# Patient Record
Sex: Male | Born: 1955 | Race: Black or African American | Hispanic: No | Marital: Married | Smoking: Current every day smoker
Health system: Southern US, Community
[De-identification: ages and names within clinical notes are randomized; demographics above are authoritative.]

## PROBLEM LIST (undated history)

## (undated) ENCOUNTER — Ambulatory Visit: Admission: EM | Payer: 59 | Source: Home / Self Care

## (undated) DIAGNOSIS — I739 Peripheral vascular disease, unspecified: Secondary | ICD-10-CM

## (undated) DIAGNOSIS — B9681 Helicobacter pylori [H. pylori] as the cause of diseases classified elsewhere: Secondary | ICD-10-CM

## (undated) DIAGNOSIS — N529 Male erectile dysfunction, unspecified: Secondary | ICD-10-CM

## (undated) DIAGNOSIS — Z87442 Personal history of urinary calculi: Secondary | ICD-10-CM

## (undated) DIAGNOSIS — I10 Essential (primary) hypertension: Secondary | ICD-10-CM

## (undated) DIAGNOSIS — R7301 Impaired fasting glucose: Secondary | ICD-10-CM

## (undated) DIAGNOSIS — K279 Peptic ulcer, site unspecified, unspecified as acute or chronic, without hemorrhage or perforation: Secondary | ICD-10-CM

## (undated) HISTORY — DX: Male erectile dysfunction, unspecified: N52.9

## (undated) HISTORY — DX: Peripheral vascular disease, unspecified: I73.9

## (undated) HISTORY — DX: Helicobacter pylori (H. pylori) as the cause of diseases classified elsewhere: B96.81

## (undated) HISTORY — DX: Essential (primary) hypertension: I10

## (undated) HISTORY — DX: Impaired fasting glucose: R73.01

## (undated) HISTORY — DX: Helicobacter pylori (H. pylori) as the cause of diseases classified elsewhere: K27.9

---

## 2007-10-18 ENCOUNTER — Ambulatory Visit: Payer: Self-pay | Admitting: Gastroenterology

## 2007-10-18 ENCOUNTER — Encounter: Payer: Self-pay | Admitting: Gastroenterology

## 2007-10-18 ENCOUNTER — Ambulatory Visit (HOSPITAL_COMMUNITY): Admission: RE | Admit: 2007-10-18 | Discharge: 2007-10-18 | Payer: Self-pay | Admitting: Gastroenterology

## 2007-10-18 HISTORY — PX: COLONOSCOPY: SHX174

## 2010-07-20 NOTE — Op Note (Signed)
Gary Harrison, Gary Harrison              ACCOUNT NO.:  [id number]   MEDICAL RECORD NO.:  [id number]          PATIENT TYPE:  AMB   LOCATION:  DAY                           FACILITY:  [facility]   PHYSICIAN:  Kassie Mends, M.D.      DATE OF BIRTH:  Sep 10, 1955   DATE OF PROCEDURE:  DATE OF DISCHARGE:                               OPERATIVE REPORT   REFERRING PHYSICIAN:  Donna Bernard, MD   PROCEDURE:  Colonoscopy with snare cautery and cold forceps polypectomy.   INDICATION FOR EXAM:  Mr. Carvalho is a 55 year old male who presents  for average risk colon cancer screening.   FINDINGS:  Three descending colon polyps.  The polyps ranged from 8 mm  to 1.2 cm.  One pedunculated sigmoid colon polyp removed via snare  cautery; others via cold forceps. One 3-mm cecal polyp removed via cold  forceps.  Otherwise, no masses, inflammatory changes, diverticular, or  arteriovenous malformations seen.   DIAGNOSIS:  Multiple 8 mm to 1.1-cm colon polyps removed.   RECOMMENDATIONS:  1. He should follow high-fiber diet.  He was given a handout on high-      fiber diet and polyps.  2. Screening colonoscopy in 5 years. First degree relatives need TCS      at age 21 and then every 5 years.  3. No aspirin, NSAIDs, or anticoagulation for 7 days.   MEDICATIONS:  1. Demerol 100 mg IV.  2. Versed 6 mg IV.   PROCEDURE TECHNIQUE:  Physical exam was performed.  Informed consent was  obtained from the patient.  After explaining the benefits, risks, and  alternatives to the procedure.  The patient was connected to monitor and  placed in left lateral position.  Continuous oxygen was provided by  nasal cannula, IV medicine administered through an indwelling cannula.  After administration of sedation and rectal exam, the patient's rectum  was intubated and scope was advanced under direct visualization to the  cecum.  The scope was removed slowly by carefully examining  the color, texture, anatomy, and integrity of  mucosa on the way out.  The patient was recovered in endoscopy and discharged to home in  satisfactory condition.   PATH:  Multiple simple adenomas.      Kassie Mends, M.D.  Electronically Signed     SM/MEDQ  D:  10/18/2007  T:  10/18/2007  Job:  [id number]   cc:   Donna Bernard, M.D.  Fax: [phone]

## 2012-08-03 ENCOUNTER — Other Ambulatory Visit: Payer: Self-pay | Admitting: Family Medicine

## 2012-09-05 ENCOUNTER — Other Ambulatory Visit: Payer: Self-pay | Admitting: Family Medicine

## 2012-11-29 ENCOUNTER — Ambulatory Visit: Payer: Self-pay | Admitting: Gastroenterology

## 2012-12-03 ENCOUNTER — Ambulatory Visit: Payer: Self-pay | Admitting: Gastroenterology

## 2012-12-27 ENCOUNTER — Encounter: Payer: Self-pay | Admitting: Gastroenterology

## 2012-12-27 ENCOUNTER — Ambulatory Visit (INDEPENDENT_AMBULATORY_CARE_PROVIDER_SITE_OTHER): Payer: 59 | Admitting: Gastroenterology

## 2012-12-27 ENCOUNTER — Encounter (INDEPENDENT_AMBULATORY_CARE_PROVIDER_SITE_OTHER): Payer: Self-pay

## 2012-12-27 VITALS — BP 160/100 | HR 75 | Temp 98.3°F | Ht 71.0 in | Wt 179.6 lb

## 2012-12-27 DIAGNOSIS — Z860100 Personal history of colon polyps, unspecified: Secondary | ICD-10-CM | POA: Insufficient documentation

## 2012-12-27 DIAGNOSIS — R198 Other specified symptoms and signs involving the digestive system and abdomen: Secondary | ICD-10-CM

## 2012-12-27 MED ORDER — PEG 3350-KCL-NA BICARB-NACL 420 G PO SOLR: 4000.0000 mL | ORAL | Status: DC

## 2012-12-27 NOTE — Patient Instructions (Signed)
We have referred you to see Dr. Gerda Diss again for management of your blood pressure.  I would like to get an ultrasound of your belly; we will call you with the results. Try to limit alcohol intake, which can damage your liver.   We are scheduling a colonoscopy with Dr. Darrick Penna, which will be done after you have seen Dr. Gerda Diss.

## 2012-12-27 NOTE — Progress Notes (Signed)
   Primary Care Physician:  Harlow Asa, MD Primary Gastroenterologist:  Dr. Darrick Penna   Chief Complaint  Patient presents with  . Colonoscopy    HPI:   Gary Harrison presents today with history of multiple simple adenomas in 2009, due for surveillance now. Denies abdominal pain. No wt loss, changes in appetite. Denies constipation, diarrhea. Low-volume hematochezia. Notes history of hematuria. No dysphagia. Blood pressure elevated. Hasn't seen PCP recently.    Past Medical History  Diagnosis Date  . Hypertension     no meds currently    Past Surgical History  Procedure Laterality Date  . Colonoscopy  10/18/2007      ZOX:WRUEA descending colon polyps.  The polyps ranged from 8 mm  to 1.2 cm.  One pedunculated sigmoid colon polyp removed via snare cautery; others via cold forceps. One 3-mm cecal polyp removed via cold forceps.  Otherwise, no masses, inflammatory changes, diverticular, or arteriovenous malformations seen. simple adenomas. Needs surveillance 2014    Current Outpatient Prescriptions  Medication Sig Dispense Refill  . fluticasone (FLONASE) 50 MCG/ACT nasal spray instill 2 sprays into each nostril once daily  16 g  6  . VIAGRA 25 MG tablet TAKE ONE TABLET 2 HOURS BEFORE SEX  10 tablet  3   No current facility-administered medications for this visit.    Allergies as of 12/27/2012  . (Not on File)    Family History  Problem Relation Age of Onset  . Colon cancer Neg Hx     History   Social History  . Marital Status: Married    Spouse Name: N/A    Number of Children: N/A  . Years of Education: N/A   Occupational History  . [employer]    Social History Main Topics  . Smoking status: Current Every Day Smoker -- 0.50 packs/day    Types: Cigarettes  . Smokeless tobacco: Not on file  . Alcohol Use: Yes     Comment: 12 pack a week  . Drug Use: No  . Sexual Activity: Not on file   Other Topics Concern  . Not on file   Social History Narrative  .  No narrative on file    Review of Systems: As mentioned in HPI.   Physical Exam: BP 160/100  Pulse 75  Temp(Src) 98.3 F (36.8 C) (Oral)  Ht 5\' 11"  (1.803 m)  Wt 179 lb 9.6 oz (81.466 kg)  BMI 25.06 kg/m2 General:   Alert and oriented. Pleasant and cooperative. Well-nourished and well-developed.  Head:  Normocephalic and atraumatic. Eyes:  Without icterus, sclera clear and conjunctiva pink.  Ears:  Normal auditory acuity. Nose:  No deformity, discharge,  or lesions. Mouth:  No deformity or lesions, oral mucosa pink.  Neck:  Supple, without mass or thyromegaly. Lungs:  Clear to auscultation bilaterally. No wheezes, rales, or rhonchi. No distress.  Heart:  S1, S2 present without murmurs appreciated.  Abdomen:  +BS, soft, non-tender and non-distended. RUQ fullness appreciated, questionable superficial lipoma, RUQ.  Rectal:  Deferred  Msk:  Symmetrical without gross deformities. Normal posture. Extremities:  Without clubbing or edema. Neurologic:  Alert and  oriented x4;  grossly normal neurologically. Skin:  Intact without significant lesions or rashes. Cervical Nodes:  No significant cervical adenopathy. Psych:  Alert and cooperative. Normal mood and affect.

## 2012-12-31 ENCOUNTER — Other Ambulatory Visit (HOSPITAL_COMMUNITY): Payer: 59

## 2012-12-31 NOTE — Assessment & Plan Note (Addendum)
Due for surveillance now. Low-volume hematochezia likely benign. BP elevated today; no medications. Needs evaluation with PCP prior to elective procedure.   Referral to Dr. Gerda Diss to likely start anti-hypertensives. Once evaluated, proceed with TCS with Dr. Darrick Penna. Risks and benefits discussed in detail with patient, who stated understanding.

## 2012-12-31 NOTE — Assessment & Plan Note (Signed)
RUQ fullness on exam; proceed with [place] of abdomen.

## 2013-01-01 NOTE — Progress Notes (Signed)
cc'd to pcp 

## 2013-01-04 ENCOUNTER — Ambulatory Visit (HOSPITAL_COMMUNITY)
Admission: RE | Admit: 2013-01-04 | Discharge: 2013-01-04 | Disposition: A | Discharge status: Home / Self Care | Payer: 59 | Source: Ambulatory Visit | Attending: Gastroenterology | Admitting: Gastroenterology

## 2013-01-04 DIAGNOSIS — R198 Other specified symptoms and signs involving the digestive system and abdomen: Secondary | ICD-10-CM

## 2013-01-04 DIAGNOSIS — R1011 Right upper quadrant pain: Secondary | ICD-10-CM | POA: Insufficient documentation

## 2013-01-11 ENCOUNTER — Encounter: Payer: Self-pay | Admitting: Family Medicine

## 2013-01-11 ENCOUNTER — Ambulatory Visit (INDEPENDENT_AMBULATORY_CARE_PROVIDER_SITE_OTHER): Payer: 59 | Admitting: Family Medicine

## 2013-01-11 VITALS — BP 150/92 | Ht 71.0 in | Wt 180.0 lb

## 2013-01-11 DIAGNOSIS — Z125 Encounter for screening for malignant neoplasm of prostate: Secondary | ICD-10-CM

## 2013-01-11 DIAGNOSIS — E785 Hyperlipidemia, unspecified: Secondary | ICD-10-CM

## 2013-01-11 DIAGNOSIS — R198 Other specified symptoms and signs involving the digestive system and abdomen: Secondary | ICD-10-CM

## 2013-01-11 DIAGNOSIS — I10 Essential (primary) hypertension: Secondary | ICD-10-CM

## 2013-01-11 DIAGNOSIS — Z79899 Other long term (current) drug therapy: Secondary | ICD-10-CM

## 2013-01-11 LAB — BASIC METABOLIC PANEL
BUN: 14 mg/dL (ref 6–23)
Chloride: 101 mEq/L (ref 96–112)
Creat: 0.88 mg/dL (ref 0.50–1.35)
Glucose, Bld: 97 mg/dL (ref 70–99)
Potassium: 4.6 mEq/L (ref 3.5–5.3)
Sodium: 140 mEq/L (ref 135–145)

## 2013-01-11 LAB — HEPATIC FUNCTION PANEL
ALT: 26 U/L (ref 0–53)
AST: 26 U/L (ref 0–37)
Alkaline Phosphatase: 87 U/L (ref 39–117)
Bilirubin, Direct: 0.1 mg/dL (ref 0.0–0.3)
Indirect Bilirubin: 0.4 mg/dL (ref 0.0–0.9)
Total Bilirubin: 0.5 mg/dL (ref 0.3–1.2)

## 2013-01-11 LAB — LIPID PANEL
Cholesterol: 173 mg/dL (ref 0–200)
Triglycerides: 298 mg/dL — ABNORMAL HIGH (ref <150)
VLDL: 60 mg/dL — ABNORMAL HIGH (ref 0–40)

## 2013-01-11 MED ORDER — ENALAPRIL MALEATE 10 MG PO TABS: 10.0000 mg | ORAL_TABLET | Freq: Every day | ORAL | Status: DC

## 2013-01-11 NOTE — Progress Notes (Signed)
  Subjective:    Patient ID: Gary Harrison, male    DOB: 30-Jun-1955, 57 y.o.   MRN: [id number]  HPI Patient is here today due to high BP's.  He was going to get a colonoscopy done, but his BP was too high (150's/90's) so they sent him here to get checked out.  Pt is under a lot of stress as well.   Did ultrasound after they palpated "somehting" pt unaware of results. Patient states he is unaware of ultrasound results. On further history he does drink a fair amount of alcohol a least 12 beers per week along with several mixed drinks. Strong family history of hypertension.  Review of prior numbers over the last several years reveal elevated blood pressure Review of Systems No chest pain no back pain no abdominal pain no change in bowel habits no blood in stool ROS otherwise negative    Objective:   Physical Exam Alert HEENT normal. Lungs clear. Heart rare rhythm. Right upper quadrant head showed liver palpated. Not tender. Abdomen otherwise normal. Blood pressure 152/94 on repeat.     Assessment & Plan:  Impression 1 hypertension time the start medicine. Discussed with patient. Number to call liver with changes potential he significant and consistent with possible cirrhosis discussed. Patient encouraged to stop drinking alcohol right away. Plan 25 minutes spent most in discussion. Initiate Vasotec 10 mg each bedtime. Followup as scheduled. Followup with the GI specialist.

## 2013-01-12 LAB — PSA: PSA: 0.64 ng/mL (ref <=4.00)

## 2013-01-13 DIAGNOSIS — I10 Essential (primary) hypertension: Secondary | ICD-10-CM | POA: Insufficient documentation

## 2013-01-13 DIAGNOSIS — E785 Hyperlipidemia, unspecified: Secondary | ICD-10-CM | POA: Insufficient documentation

## 2013-01-13 NOTE — Progress Notes (Signed)
Quick Note:  Likely cirrhosis on [place] of abdomen.  +ETOH use.  Recent LFTs normal.  Would recommend EGD at time of TCS with Dr. Darrick Penna for variceal screening. Please add to TCS and inform patient.  Needs f/u with me or [facility] in about 4 weeks to work-up cirrhosis. ______

## 2013-01-14 ENCOUNTER — Other Ambulatory Visit: Payer: Self-pay | Admitting: Gastroenterology

## 2013-01-14 DIAGNOSIS — K7460 Unspecified cirrhosis of liver: Secondary | ICD-10-CM

## 2013-01-14 DIAGNOSIS — Z86010 Personal history of colonic polyps: Secondary | ICD-10-CM

## 2013-01-14 DIAGNOSIS — R198 Other specified symptoms and signs involving the digestive system and abdomen: Secondary | ICD-10-CM

## 2013-01-14 NOTE — Progress Notes (Signed)
Quick Note:  Called and informed pt. ______ 

## 2013-01-15 ENCOUNTER — Encounter (HOSPITAL_COMMUNITY): Payer: Self-pay | Admitting: Pharmacy Technician

## 2013-01-15 ENCOUNTER — Ambulatory Visit: Payer: Self-pay | Admitting: Family Medicine

## 2013-01-16 NOTE — Progress Notes (Signed)
Patient has seen Dr. Gerda Diss and started on antihypertensive therapy.   Also noted, [place] of abdomen showed likely cirrhosis. LFTs normal. EGD to be performed at time of TCS. Patient aware.

## 2013-01-25 ENCOUNTER — Encounter (HOSPITAL_COMMUNITY)
Admission: RE | Disposition: A | Discharge status: Home / Self Care | Payer: Self-pay | Source: Ambulatory Visit | Attending: Gastroenterology

## 2013-01-25 ENCOUNTER — Encounter (HOSPITAL_COMMUNITY): Payer: Self-pay | Admitting: *Deleted

## 2013-01-25 ENCOUNTER — Ambulatory Visit (HOSPITAL_COMMUNITY)
Admission: RE | Admit: 2013-01-25 | Discharge: 2013-01-25 | Disposition: A | Discharge status: Home / Self Care | Payer: 59 | Source: Ambulatory Visit | Attending: Gastroenterology | Admitting: Gastroenterology

## 2013-01-25 DIAGNOSIS — Z860100 Personal history of colon polyps, unspecified: Secondary | ICD-10-CM

## 2013-01-25 DIAGNOSIS — K7460 Unspecified cirrhosis of liver: Secondary | ICD-10-CM

## 2013-01-25 DIAGNOSIS — K269 Duodenal ulcer, unspecified as acute or chronic, without hemorrhage or perforation: Secondary | ICD-10-CM

## 2013-01-25 DIAGNOSIS — Z1211 Encounter for screening for malignant neoplasm of colon: Secondary | ICD-10-CM

## 2013-01-25 DIAGNOSIS — R1013 Epigastric pain: Secondary | ICD-10-CM

## 2013-01-25 DIAGNOSIS — K259 Gastric ulcer, unspecified as acute or chronic, without hemorrhage or perforation: Secondary | ICD-10-CM

## 2013-01-25 DIAGNOSIS — I10 Essential (primary) hypertension: Secondary | ICD-10-CM | POA: Insufficient documentation

## 2013-01-25 DIAGNOSIS — A048 Other specified bacterial intestinal infections: Secondary | ICD-10-CM | POA: Insufficient documentation

## 2013-01-25 DIAGNOSIS — K3189 Other diseases of stomach and duodenum: Secondary | ICD-10-CM | POA: Insufficient documentation

## 2013-01-25 DIAGNOSIS — K648 Other hemorrhoids: Secondary | ICD-10-CM | POA: Insufficient documentation

## 2013-01-25 DIAGNOSIS — D126 Benign neoplasm of colon, unspecified: Secondary | ICD-10-CM

## 2013-01-25 DIAGNOSIS — K2970 Gastritis, unspecified, without bleeding: Secondary | ICD-10-CM | POA: Insufficient documentation

## 2013-01-25 DIAGNOSIS — R198 Other specified symptoms and signs involving the digestive system and abdomen: Secondary | ICD-10-CM

## 2013-01-25 DIAGNOSIS — K277 Chronic peptic ulcer, site unspecified, without hemorrhage or perforation: Secondary | ICD-10-CM | POA: Insufficient documentation

## 2013-01-25 DIAGNOSIS — K449 Diaphragmatic hernia without obstruction or gangrene: Secondary | ICD-10-CM | POA: Insufficient documentation

## 2013-01-25 HISTORY — PX: COLONOSCOPY WITH ESOPHAGOGASTRODUODENOSCOPY (EGD): SHX5779

## 2013-01-25 SURGERY — COLONOSCOPY WITH ESOPHAGOGASTRODUODENOSCOPY (EGD)
Anesthesia: Moderate Sedation

## 2013-01-25 SURGERY — COLONOSCOPY
Anesthesia: Moderate Sedation

## 2013-01-25 MED ORDER — MEPERIDINE HCL 100 MG/ML IJ SOLN
INTRAMUSCULAR | Status: AC
  Filled 2013-01-25: qty 2

## 2013-01-25 MED ORDER — MIDAZOLAM HCL 5 MG/5ML IJ SOLN
INTRAMUSCULAR | Status: DC | PRN
  Administered 2013-01-25: 1 mg via INTRAVENOUS
  Administered 2013-01-25 (×2): 2 mg via INTRAVENOUS

## 2013-01-25 MED ORDER — OMEPRAZOLE 20 MG PO CPDR: DELAYED_RELEASE_CAPSULE | ORAL | Status: DC

## 2013-01-25 MED ORDER — BUTAMBEN-TETRACAINE-BENZOCAINE 2-2-14 % EX AERO
INHALATION_SPRAY | CUTANEOUS | Status: AC
  Filled 2013-01-25: qty 56

## 2013-01-25 MED ORDER — MEPERIDINE HCL 100 MG/ML IJ SOLN
INTRAMUSCULAR | Status: DC | PRN
  Administered 2013-01-25 (×2): 25 mg via INTRAVENOUS
  Administered 2013-01-25: 50 mg via INTRAVENOUS

## 2013-01-25 MED ORDER — SODIUM CHLORIDE 0.9 % IV SOLN
INTRAVENOUS | Status: DC
  Administered 2013-01-25: 10:00:00 via INTRAVENOUS

## 2013-01-25 MED ORDER — BUTAMBEN-TETRACAINE-BENZOCAINE 2-2-14 % EX AERO
INHALATION_SPRAY | CUTANEOUS | Status: DC | PRN
  Administered 2013-01-25: 2 via TOPICAL

## 2013-01-25 MED ORDER — MIDAZOLAM HCL 5 MG/5ML IJ SOLN
INTRAMUSCULAR | Status: AC
  Filled 2013-01-25: qty 10

## 2013-01-25 MED ORDER — STERILE WATER FOR IRRIGATION IR SOLN
Status: DC | PRN
  Administered 2013-01-25: 11:00:00

## 2013-01-25 NOTE — Op Note (Signed)
[facility] [address] [place], [address]   COLONOSCOPY PROCEDURE REPORT  PATIENT: Gary Harrison, Gary Harrison  MR#: [id number] BIRTHDATE: 02-12-1956 , 57  yrs. old GENDER: Male ENDOSCOPIST: Jonette Eva, MD REFERRED [id number] Gerda Diss, M.D. PROCEDURE DATE:  01/25/2013 PROCEDURE:   Colonoscopy with cold biopsy polypectomy INDICATIONS:Patient's personal history of adenomatous colon polyps.  MEDICATIONS: Demerol 75 mg IV and Versed 4 mg IV  DESCRIPTION OF PROCEDURE:    Physical exam was performed.  Informed consent was obtained from the patient after explaining the benefits, risks, and alternatives to procedure.  The patient was connected to monitor and placed in left lateral position. Continuous oxygen was provided by nasal cannula and IV medicine administered through an indwelling cannula.  After administration of sedation and rectal exam, the patients rectum was intubated and the EC-3890Li ([id number])  colonoscope was advanced under direct visualization to the ileum.  The scope was removed slowly by carefully examining the color, texture, anatomy, and integrity mucosa on the way out.  The patient was recovered in endoscopy and discharged home in satisfactory condition.    COLON FINDINGS: The mucosa appeared normal in the terminal ileum.  , Eight sessile polyps measuring 2-4 mm in size were found at the ileocecal valve, cecum, hepatic flexure, and in the sigmoid colon. A polypectomy was performed with cold forceps.  , Small internal hemorrhoids were found.  , and The colon mucosa was otherwise normal.  PREP QUALITY: good. CECAL W/D TIME: 17 minutes     COMPLICATIONS: None  ENDOSCOPIC IMPRESSION: 1.   Small internal hemorrhoids 2.   8 COLON POLYPS REMOVED   RECOMMENDATIONS: AWAIT BIOPSY HIGH FIBER DIET TCS IN 5 YEARS IF HYPERPPLASTIC AND 3 YEARS IF SIMPLE ADENOMAS       _______________________________ Rosalie DoctorJonette Eva, MD 01/25/2013 11:19  AM

## 2013-01-25 NOTE — H&P (Signed)
  Primary Care Physician:  Harlow Asa, MD Primary Gastroenterologist:  Dr. Darrick Penna  Pre-Procedure History & Physical: HPI:  Gary Harrison is a 57 y.o. male here for  PERSONAL HISTORY OF POLYPS.  Past Medical History  Diagnosis Date  . Hypertension     no meds currently    Past Surgical History  Procedure Laterality Date  . Colonoscopy  10/18/2007      WUJ:[id number] descending colon polyps.  The polyps ranged from 8 mm  to 1.2 cm.  One pedunculated sigmoid colon polyp removed via snare cautery; others via cold forceps. One 3-mm cecal polyp removed via cold forceps.  Otherwise, no masses, inflammatory changes, diverticular, or arteriovenous malformations seen. simple adenomas. Needs surveillance 2014    Prior to Admission medications   Medication Sig Start Date End Date Taking? Authorizing Provider  enalapril (VASOTEC) 10 MG tablet Take 1 tablet (10 mg total) by mouth at bedtime. 01/11/13  Yes Merlyn Albert, MD    Allergies as of 12/27/2012  . (Not on File)    Family History  Problem Relation Age of Onset  . Colon cancer Neg Hx     History   Social History  . Marital Status: Married    Spouse Name: N/A    Number of Children: N/A  . Years of Education: N/A   Occupational History  . [employer]    Social History Main Topics  . Smoking status: Current Every Day Smoker -- 0.50 packs/day    Types: Cigarettes  . Smokeless tobacco: Not on file  . Alcohol Use: Yes     Comment: 12 pack a week  . Drug Use: No  . Sexual Activity: Not on file   Other Topics Concern  . Not on file   Social History Narrative  . No narrative on file    Review of Systems: See HPI, otherwise negative ROS   Physical Exam: There were no vitals taken for this visit. General:   Alert,  pleasant and cooperative in NAD Head:  Normocephalic and atraumatic. Neck:  Supple; Lungs:  Clear throughout to auscultation.    Heart:  Regular rate and rhythm. Abdomen:  Soft, nontender and  nondistended. Normal bowel sounds, without guarding, and without rebound.   Neurologic:  Alert and  oriented x4;  grossly normal neurologically.  Impression/Plan:     PERSONAL HISTORY OF POLYPS.  PLAN: 1. TCS TODAY

## 2013-01-25 NOTE — Op Note (Signed)
[facility] [address] [place], [address]   ENDOSCOPY PROCEDURE REPORT  PATIENT: Gary Harrison, Gary Harrison  MR#: [id number] BIRTHDATE: 1955-03-17 , 57  yrs. old GENDER: Male  ENDOSCOPIST: Jonette Eva, MD REFERRED [id number] Gerda Diss, M.D.  PROCEDURE DATE: 01/25/2013 PROCEDURE:   EGD w/ biopsy  INDICATIONS:Dyspepsia.   Screening for varices. PMHX: DAILY ETOH. PT REPORTS HE HAS STOPPED. MEDICATIONS: TCS+ Demerol 25 mg IV and Versed 1mg  IV TOPICAL ANESTHETIC:   Cetacaine Spray  DESCRIPTION OF PROCEDURE:     Physical exam was performed.  Informed consent was obtained from the patient after explaining the benefits, risks, and alternatives to the procedure.  The patient was connected to the monitor and placed in the left lateral position.  Continuous oxygen was provided by nasal cannula and IV medicine administered through an indwelling cannula.  After administration of sedation, the patients esophagus was intubated and the EC-3890Li ([id number]) and EG-[id number] ([id number])  endoscope was advanced under direct visualization to the second portion of the duodenum.  The scope was removed slowly by carefully examining the color, texture, anatomy, and integrity of the mucosa on the way out.  The patient was recovered in endoscopy and discharged home in satisfactory condition.   ESOPHAGUS: The mucosa of the esophagus appeared normal.   A small hiatal hernia was noted.   STOMACH: Multiple small ulcers with heaped up edges were found in the gastric antrum.  Biopsies were taken around the ulcers.   DUODENUM: Three small ulcers were found in the duodenal bulb.   The duodenal mucosa showed no abnormalities in the 2nd part of the duodenum.  COMPLICATIONS:   None  ENDOSCOPIC IMPRESSION: 1.   Multiple small ulcers  in the gastric antrum 2.   Small hiatal hernia 3.   SMALL ULCERS-duodenal bulb 5.   DYSPEPSIA DUE TO PUD  RECOMMENDATIONS: START OMEPRAZOLE.  TAKE PRIOR TO YOUR MEALS  TWICE DAILY FOR 3 MOS THEN DAILY. CUT DOWN ON YOUR ALCOHOL USE. AVOID ITEMS THAT TRIGGER ULCERS. FOLLOW A HIGH FIBER/LOW FAT DIET.  AVOID ITEMS THAT CAUSE BLOATING.  BIOPSY RESULTS WILL BE BACK IN 7 DAYS. FOLLOW UP IN 3 MOS.   REPEAT EXAM:   _______________________________ Rosalie DoctorJonette Eva, MD 01/25/2013 1:52 PM       PATIENT NAME:  Gary Harrison, Gary Harrison MR#: [id number]

## 2013-01-29 ENCOUNTER — Telehealth: Payer: Self-pay | Admitting: Gastroenterology

## 2013-01-29 MED ORDER — AMOXICILLIN 500 MG PO TABS: ORAL_TABLET | ORAL | Status: DC

## 2013-01-29 MED ORDER — CLARITHROMYCIN 500 MG PO TABS: ORAL_TABLET | ORAL | Status: DC

## 2013-01-29 NOTE — Telephone Encounter (Signed)
Called and informed pt.  

## 2013-01-29 NOTE — Telephone Encounter (Signed)
Results Cc to PCP  

## 2013-01-29 NOTE — Telephone Encounter (Signed)
PLEASE CALL PT. HIS stomach Bx showed H. Pylori infection. He needs AMOXICILLIN 500 mg 2 po BID for 10 days and Biaxin 500 mg po bid for 10 days. CONTINUE OMEPRAZOLE BID..Med side effects include NVD, abd pain, and metallic taste. He had A simple adenoma removed from hIS colon.    CONTINUE TO AVOID ETOH. AVOID ITEMS THAT TRIGGER ULCERS.  FOLLOW A HIGH FIBER/LOW FAT DIET. AVOID ITEMS THAT CAUSE BLOATING.  FOLLOW UP IN 3 MOS E30 SLF CIRRHOSIS HE WILL NEED A REPEAT EGD TO ASSESS HEALING OF HIS ULCER AFTER HIS VISIT IN FEB 2015. NEXT COLONOSCOPY IN 10 YEARS BECAUSE ONLY ONE OF HIS POLYS WAS A SIMPLE ADENOMA.

## 2013-01-30 ENCOUNTER — Encounter (HOSPITAL_COMMUNITY): Payer: Self-pay | Admitting: Gastroenterology

## 2013-01-30 NOTE — Telephone Encounter (Signed)
Reminder in epic °

## 2013-02-01 ENCOUNTER — Encounter: Payer: Self-pay | Admitting: Family Medicine

## 2013-03-14 ENCOUNTER — Encounter: Payer: Self-pay | Admitting: Family Medicine

## 2013-03-14 ENCOUNTER — Ambulatory Visit (INDEPENDENT_AMBULATORY_CARE_PROVIDER_SITE_OTHER): Payer: 59 | Admitting: Family Medicine

## 2013-03-14 VITALS — BP 130/82 | Ht 71.0 in | Wt 179.2 lb

## 2013-03-14 DIAGNOSIS — E785 Hyperlipidemia, unspecified: Secondary | ICD-10-CM

## 2013-03-14 DIAGNOSIS — J309 Allergic rhinitis, unspecified: Secondary | ICD-10-CM

## 2013-03-14 DIAGNOSIS — I10 Essential (primary) hypertension: Secondary | ICD-10-CM

## 2013-03-14 MED ORDER — TRIAMCINOLONE ACETONIDE 0.1 % EX CREA: 1.0000 "application " | TOPICAL_CREAM | Freq: Two times a day (BID) | CUTANEOUS | Status: DC

## 2013-03-14 NOTE — Progress Notes (Signed)
   Subjective:    Patient ID: Gary Harrison, male    DOB: 24-Mar-1955, 58 y.o.   MRN: [id number]  Hypertension This is a new problem. The current episode started more than 1 month ago. The problem has been resolved since onset. The problem is controlled. There are no associated agents to hypertension. There are no known risk factors for coronary artery disease. Treatments tried: enalapril. The current treatment provides significant improvement. There are no compliance problems.   Patient wants to discuss stopping the enalapril because he is having trouble sleeping and has headaches since starting on this medication.   vasotec pt states giving him nightmares headaches and dehydration  Pt has cut down the drinking  Patient reports chronic rhinitis stable as long as she sticks with the Flonase. No obvious side effects from the medicine.  Hx of h pylori recently. Treated patient states he took all the medication up. Reports her reflux is considerably improved. Still taking omeprazole twice per day.  Was also advised by his gastroenterologist and he needs to substantially cut down alcohol intake and stop if at all possible he is trying hard to this.  Patient notes rash. Dorsal foot. Pruritic in nature. Has some leftover ketoconazole cream which has not seemed to help very much. Review of Systems No chest pain no headache no back pain no abdominal pain no change in bowel habits no blood in stool ROS otherwise negative    Objective:   Physical Exam Alert no apparent distress. H&T sinus congestion next upper. Lungs clear. Heart regular in rhythm. Abdomen benign. Right dorsal foot eczema-like rash eruption noted       Assessment & Plan:  Impression #1 hypertension. Control improved #2 Vasotec side effect patient reports unable to take the medication unfortunately. Do to a parent headaches etc. #3 dermatitis discussed #4 chronic rhinitis stable allergic in nature. #5 reflux stable plan stop the  enalapril. Recheck in several months. Blood pressure goes back up considerably will need medication at that time. Maintain Flonase. Maintain Prilosec. Add triamcinolone cream twice a day when necessary. WSL

## 2013-04-16 ENCOUNTER — Other Ambulatory Visit: Payer: Self-pay | Admitting: Family Medicine

## 2013-09-05 ENCOUNTER — Other Ambulatory Visit: Payer: Self-pay | Admitting: Family Medicine

## 2013-09-05 NOTE — Telephone Encounter (Signed)
Last seen 03/14/13.

## 2013-11-20 ENCOUNTER — Other Ambulatory Visit: Payer: Self-pay | Admitting: *Deleted

## 2013-11-20 MED ORDER — FLUTICASONE PROPIONATE 50 MCG/ACT NA SUSP
NASAL | Status: DC
Start: 2013-11-20 — End: 2013-11-26

## 2013-11-26 ENCOUNTER — Other Ambulatory Visit: Payer: Self-pay | Admitting: *Deleted

## 2013-11-26 MED ORDER — FLUTICASONE PROPIONATE 50 MCG/ACT NA SUSP: NASAL | Status: DC

## 2014-04-29 ENCOUNTER — Other Ambulatory Visit: Payer: Self-pay | Admitting: Family Medicine

## 2014-06-05 ENCOUNTER — Encounter: Payer: Self-pay | Admitting: Family Medicine

## 2014-06-05 ENCOUNTER — Ambulatory Visit (INDEPENDENT_AMBULATORY_CARE_PROVIDER_SITE_OTHER): Payer: 59 | Admitting: Family Medicine

## 2014-06-05 VITALS — BP 120/82 | Ht 71.0 in | Wt 181.0 lb

## 2014-06-05 DIAGNOSIS — R1032 Left lower quadrant pain: Secondary | ICD-10-CM

## 2014-06-05 DIAGNOSIS — N528 Other male erectile dysfunction: Secondary | ICD-10-CM

## 2014-06-05 DIAGNOSIS — Z125 Encounter for screening for malignant neoplasm of prostate: Secondary | ICD-10-CM

## 2014-06-05 DIAGNOSIS — E785 Hyperlipidemia, unspecified: Secondary | ICD-10-CM

## 2014-06-05 DIAGNOSIS — Z79899 Other long term (current) drug therapy: Secondary | ICD-10-CM | POA: Diagnosis not present

## 2014-06-05 DIAGNOSIS — I10 Essential (primary) hypertension: Secondary | ICD-10-CM | POA: Diagnosis not present

## 2014-06-05 DIAGNOSIS — N529 Male erectile dysfunction, unspecified: Secondary | ICD-10-CM

## 2014-06-05 LAB — POCT URINALYSIS DIPSTICK
SPEC GRAV UA: 1.01
pH, UA: 6

## 2014-06-05 MED ORDER — ETODOLAC 400 MG PO TABS: 400.0000 mg | ORAL_TABLET | Freq: Two times a day (BID) | ORAL | Status: DC

## 2014-06-05 MED ORDER — TADALAFIL 10 MG PO TABS: 10.0000 mg | ORAL_TABLET | Freq: Every day | ORAL | Status: DC | PRN

## 2014-06-05 NOTE — Addendum Note (Signed)
Addended by: Margaretmary Eddy on: 06/05/2014 10:41 PM   Modules accepted: Orders

## 2014-06-05 NOTE — Progress Notes (Signed)
   Subjective:    Patient ID: Gary Harrison, male    DOB: 1956/01/03, 59 y.o.   MRN: [id number]  HPI Patient arrives for left side abd pain for 2 days- getting worse. Worse with certain motions. On further history did move a lot of mulch this past weekend.  fever, nausea or diarrheea  freq urination, sudden urgency notes frequent urination really has been compliant for months.  Patient no longer on blood pressure medicine. Handling salt intake well. Numbers generally good.  Has been told by his insurance company he needs to stop Viagra and start using Cialis. Requests a change in this regard.  Does not get up at night to urinate  Results for orders placed or performed in visit on 06/05/14  POCT urinalysis dipstick  Result Value Ref Range   Color, UA     Clarity, UA     Glucose, UA     Bilirubin, UA     Ketones, UA     Spec Grav, UA 1.010    Blood, UA     pH, UA 6.0    Protein, UA     Urobilinogen, UA     Nitrite, UA     Leukocytes, UA      Review of Systems No headache no chest pain no back pain no abdominal pain no change in bowel habits    Objective:   Physical Exam  Alert no acute distress. HEENT normal lungs clear. Heart rare rhythm left lateral chest wall tender to palpation abdominal exam negative blood pressure good on repeat.      Assessment & Plan:  Impression 1 chest wall pain discussed #2 hypertension good control off meds #3 erectile dysfunction discussed plan trial of Lodine for chest wall pain. Switch to Cialis. Diet exercise discussed. Cut down salt. Wellness exam encourage. Along with blood work [employer]

## 2014-06-06 ENCOUNTER — Other Ambulatory Visit: Payer: Self-pay | Admitting: Family Medicine

## 2014-06-07 ENCOUNTER — Encounter: Payer: Self-pay | Admitting: *Deleted

## 2014-06-07 LAB — BASIC METABOLIC PANEL
BUN/Creatinine Ratio: 13 (ref 9–20)
BUN: 13 mg/dL (ref 6–24)
CO2: 25 mmol/L (ref 18–29)
CREATININE: 0.97 mg/dL (ref 0.76–1.27)
Calcium: 9.6 mg/dL (ref 8.7–10.2)
Chloride: 100 mmol/L (ref 97–108)
GFR calc Af Amer: 100 mL/min/{1.73_m2} (ref >59)
GFR, EST NON AFRICAN AMERICAN: 87 mL/min/{1.73_m2} (ref >59)
GLUCOSE: 99 mg/dL (ref 65–99)
Potassium: 4.7 mmol/L (ref 3.5–5.2)
SODIUM: 141 mmol/L (ref 134–144)

## 2014-06-07 LAB — LIPID PANEL
Chol/HDL Ratio: 5 ratio units (ref 0.0–5.0)
Cholesterol, Total: 181 mg/dL (ref 100–199)
HDL: 36 mg/dL — AB (ref >39)
LDL Calculated: 114 mg/dL — ABNORMAL HIGH (ref 0–99)
TRIGLYCERIDES: 154 mg/dL — AB (ref 0–149)
VLDL Cholesterol Cal: 31 mg/dL (ref 5–40)

## 2014-06-07 LAB — HEPATIC FUNCTION PANEL
ALT: 28 IU/L (ref 0–44)
AST: 21 IU/L (ref 0–40)
Albumin: 4.6 g/dL (ref 3.5–5.5)
Alkaline Phosphatase: 79 IU/L (ref 39–117)
BILIRUBIN TOTAL: 0.6 mg/dL (ref 0.0–1.2)
BILIRUBIN, DIRECT: 0.17 mg/dL (ref 0.00–0.40)
Total Protein: 7.5 g/dL (ref 6.0–8.5)

## 2014-06-07 LAB — PSA: PSA: 0.5 ng/mL (ref 0.0–4.0)

## 2014-06-15 ENCOUNTER — Encounter: Payer: Self-pay | Admitting: Family Medicine

## 2014-07-22 ENCOUNTER — Encounter: Payer: 59 | Admitting: Family Medicine

## 2014-07-22 DIAGNOSIS — Z029 Encounter for administrative examinations, unspecified: Secondary | ICD-10-CM

## 2014-07-30 ENCOUNTER — Encounter: Payer: 59 | Admitting: Family Medicine

## 2014-08-01 ENCOUNTER — Ambulatory Visit (INDEPENDENT_AMBULATORY_CARE_PROVIDER_SITE_OTHER): Payer: 59 | Admitting: Family Medicine

## 2014-08-01 ENCOUNTER — Encounter: Payer: Self-pay | Admitting: Family Medicine

## 2014-08-01 VITALS — BP 132/78 | Ht 69.25 in | Wt 176.0 lb

## 2014-08-01 DIAGNOSIS — Z0000 Encounter for general adult medical examination without abnormal findings: Secondary | ICD-10-CM

## 2014-08-01 DIAGNOSIS — N528 Other male erectile dysfunction: Secondary | ICD-10-CM

## 2014-08-01 DIAGNOSIS — N529 Male erectile dysfunction, unspecified: Secondary | ICD-10-CM

## 2014-08-01 DIAGNOSIS — I10 Essential (primary) hypertension: Secondary | ICD-10-CM

## 2014-08-01 DIAGNOSIS — R21 Rash and other nonspecific skin eruption: Secondary | ICD-10-CM | POA: Diagnosis not present

## 2014-08-01 MED ORDER — TRIAMCINOLONE ACETONIDE 0.1 % EX CREA: 1.0000 "application " | TOPICAL_CREAM | Freq: Two times a day (BID) | CUTANEOUS | Status: DC

## 2014-08-01 NOTE — Progress Notes (Signed)
Subjective:    Patient ID: Gary Harrison, male    DOB: 1955-03-22, 59 y.o.   MRN: [id number]  HPI The patient comes in today for a wellness visit.    A review of their health history was completed.  A review of medications was also completed.  Any needed refills; yes triamcinolone cream.gets irritanrash and it helps.  Exercise regulaly  Const walking and staying active  Results for orders placed or performed in visit on 06/05/14  Lipid panel  Result Value Ref Range   Cholesterol, Total 181 100 - 199 mg/dL   Triglycerides 154 (H) 0 - 149 mg/dL   HDL 36 (L) >39 mg/dL   VLDL Cholesterol Cal 31 5 - 40 mg/dL   LDL Calculated 114 (H) 0 - 99 mg/dL   Chol/HDL Ratio 5.0 0.0 - 5.0 ratio units  Hepatic function panel  Result Value Ref Range   Total Protein 7.5 6.0 - 8.5 g/dL   Albumin 4.6 3.5 - 5.5 g/dL   Bilirubin Total 0.6 0.0 - 1.2 mg/dL   Bilirubin, Direct 0.17 0.00 - 0.40 mg/dL   Alkaline Phosphatase 79 39 - 117 IU/L   AST 21 0 - 40 IU/L   ALT 28 0 - 44 IU/L  Basic metabolic panel  Result Value Ref Range   Glucose 99 65 - 99 mg/dL   BUN 13 6 - 24 mg/dL   Creatinine, Ser 0.97 0.76 - 1.27 mg/dL   GFR calc non Af Amer 87 >59 mL/min/1.73   GFR calc Af Amer 100 >59 mL/min/1.73   BUN/Creatinine Ratio 13 9 - 20   Sodium 141 134 - 144 mmol/L   Potassium 4.7 3.5 - 5.2 mmol/L   Chloride 100 97 - 108 mmol/L   CO2 25 18 - 29 mmol/L   Calcium 9.6 8.7 - 10.2 mg/dL  PSA  Result Value Ref Range   PSA 0.5 0.0 - 4.0 ng/mL  POCT urinalysis dipstick  Result Value Ref Range   Color, UA     Clarity, UA     Glucose, UA     Bilirubin, UA     Ketones, UA     Spec Grav, UA 1.010    Blood, UA     pH, UA 6.0    Protein, UA     Urobilinogen, UA     Nitrite, UA     Leukocytes, UA       Eating habits: health conscious  Falls/  MVA accidents in past few months: none  Regular exercise: yard work, walking constantly at work.  Specialist pt sees on regular basis:  none  Preventative health issues were discussed.   Additional concerns: needs refill on triamcinolone cream.   Bump on chest. Came up 1 -2 weeks ago.  Left foot pain for the past 1 -2 weeks.   Did colon in 2014     Review of Systems  Constitutional: Negative for fever, activity change and appetite change.  HENT: Negative for congestion and rhinorrhea.   Eyes: Negative for discharge.  Respiratory: Negative for cough and wheezing.   Cardiovascular: Negative for chest pain.  Gastrointestinal: Negative for vomiting, abdominal pain and blood in stool.  Genitourinary: Negative for frequency and difficulty urinating.  Musculoskeletal: Negative for neck pain.  Skin: Negative for rash.       See present illness  Allergic/Immunologic: Negative for environmental allergies and food allergies.  Neurological: Negative for weakness and headaches.  Psychiatric/Behavioral: Negative for agitation.  All other systems reviewed  and are negative.      Objective:   Physical Exam  Constitutional: He appears well-developed and well-nourished.  HENT:  Head: Normocephalic and atraumatic.  Right Ear: External ear normal.  Left Ear: External ear normal.  Nose: Nose normal.  Mouth/Throat: Oropharynx is clear and moist.  Eyes: EOM are normal. Pupils are equal, round, and reactive to light.  Neck: Normal range of motion. Neck supple. No thyromegaly present.  Cardiovascular: Normal rate, regular rhythm and normal heart sounds.   No murmur heard. Pulmonary/Chest: Effort normal and breath sounds normal. No respiratory distress. He has no wheezes.  Abdominal: Soft. Bowel sounds are normal. He exhibits no distension and no mass. There is no tenderness.  Genitourinary: Prostate normal and penis normal.  Musculoskeletal: Normal range of motion. He exhibits no edema.  Left mid foot lateral tenderness palpation good range of motion no deformity  Lymphadenopathy:    He has no cervical adenopathy.   Neurological: He is alert. He exhibits normal muscle tone.  Skin: Skin is warm and dry. No erythema.  Distinct sebaceous cyst with oxidation creating black plug on anterior mid chest  Psychiatric: He has a normal mood and affect. His behavior is normal. Judgment normal.          Assessment & Plan:  Impression 1 wellness exam #2 skin lesion benign patient given dermatologist list #3 metatarsalgia symptom care discussed #4 hypertension stable off medications for now. plan up-to-date on colonoscopy. Diet exercise discussed. Medications refilled. Patient uses triamcinolone when necessary refilled. Strongly encouraged to stop smoking and smoking cessation discussed WSL

## 2014-08-01 NOTE — Patient Instructions (Signed)
Smoking Cessation Quitting smoking is important to your health and has many advantages. However, it is not always easy to quit since nicotine is a very addictive drug. Oftentimes, people try 3 times or more before being able to quit. This document explains the best ways for you to prepare to quit smoking. Quitting takes hard work and a lot of effort, but you can do it. ADVANTAGES OF QUITTING SMOKING  You will live longer, feel better, and live better.  Your body will feel the impact of quitting smoking almost immediately.  Within 20 minutes, blood pressure decreases. Your pulse returns to its normal level.  After 8 hours, carbon monoxide levels in the blood return to normal. Your oxygen level increases.  After 24 hours, the chance of having a heart attack starts to decrease. Your breath, hair, and body stop smelling like smoke.  After 48 hours, damaged nerve endings begin to recover. Your sense of taste and smell improve.  After 72 hours, the body is virtually free of nicotine. Your bronchial tubes relax and breathing becomes easier.  After 2 to 12 weeks, lungs can hold more air. Exercise becomes easier and circulation improves.  The risk of having a heart attack, stroke, cancer, or lung disease is greatly reduced.  After 1 year, the risk of coronary heart disease is cut in half.  After 5 years, the risk of stroke falls to the same as a nonsmoker.  After 10 years, the risk of lung cancer is cut in half and the risk of other cancers decreases significantly.  After 15 years, the risk of coronary heart disease drops, usually to the level of a nonsmoker.  If you are pregnant, quitting smoking will improve your chances of having a healthy baby.  The people you live with, especially any children, will be healthier.  You will have extra money to spend on things other than cigarettes. QUESTIONS TO THINK ABOUT BEFORE ATTEMPTING TO QUIT You may want to talk about your answers with your  health care provider.  Why do you want to quit?  If you tried to quit in the past, what helped and what did not?  What will be the most difficult situations for you after you quit? How will you plan to handle them?  Who can help you through the tough times? Your family? Friends? A health care provider?  What pleasures do you get from smoking? What ways can you still get pleasure if you quit? Here are some questions to ask your health care provider:  How can you help me to be successful at quitting?  What medicine do you think would be best for me and how should I take it?  What should I do if I need more help?  What is smoking withdrawal like? How can I get information on withdrawal? GET READY  Set a quit date.  Change your environment by getting rid of all cigarettes, ashtrays, matches, and lighters in your home, car, or work. Do not let people smoke in your home.  Review your past attempts to quit. Think about what worked and what did not. GET SUPPORT AND ENCOURAGEMENT You have a better chance of being successful if you have help. You can get support in many ways.  Tell your family, friends, and coworkers that you are going to quit and need their support. Ask them not to smoke around you.  Get individual, group, or telephone counseling and support. Programs are available at local hospitals and health centers. Call   your local health department for information about programs in your area.  Spiritual beliefs and practices may help some smokers quit.  Download a "quit meter" on your computer to keep track of quit statistics, such as how long you have gone without smoking, cigarettes not smoked, and money saved.  Get a self-help book about quitting smoking and staying off tobacco. LEARN NEW SKILLS AND BEHAVIORS  Distract yourself from urges to smoke. Talk to someone, go for a walk, or occupy your time with a task.  Change your normal routine. Take a different route to work.  Drink tea instead of coffee. Eat breakfast in a different place.  Reduce your stress. Take a hot bath, exercise, or read a book.  Plan something enjoyable to do every day. Reward yourself for not smoking.  Explore interactive web-based programs that specialize in helping you quit. GET MEDICINE AND USE IT CORRECTLY Medicines can help you stop smoking and decrease the urge to smoke. Combining medicine with the above behavioral methods and support can greatly increase your chances of successfully quitting smoking.  Nicotine replacement therapy helps deliver nicotine to your body without the negative effects and risks of smoking. Nicotine replacement therapy includes nicotine gum, lozenges, inhalers, nasal sprays, and skin patches. Some may be available over-the-counter and others require a prescription.  Antidepressant medicine helps people abstain from smoking, but how this works is unknown. This medicine is available by prescription.  Nicotinic receptor partial agonist medicine simulates the effect of nicotine in your brain. This medicine is available by prescription. Ask your health care provider for advice about which medicines to use and how to use them based on your health history. Your health care provider will tell you what side effects to look out for if you choose to be on a medicine or therapy. Carefully read the information on the package. Do not use any other product containing nicotine while using a nicotine replacement product.  RELAPSE OR DIFFICULT SITUATIONS Most relapses occur within the first 3 months after quitting. Do not be discouraged if you start smoking again. Remember, most people try several times before finally quitting. You may have symptoms of withdrawal because your body is used to nicotine. You may crave cigarettes, be irritable, feel very hungry, cough often, get headaches, or have difficulty concentrating. The withdrawal symptoms are only temporary. They are strongest  when you first quit, but they will go away within 10-14 days. To reduce the chances of relapse, try to:  Avoid drinking alcohol. Drinking lowers your chances of successfully quitting.  Reduce the amount of caffeine you consume. Once you quit smoking, the amount of caffeine in your body increases and can give you symptoms, such as a rapid heartbeat, sweating, and anxiety.  Avoid smokers because they can make you want to smoke.  Do not let weight gain distract you. Many smokers will gain weight when they quit, usually less than 10 pounds. Eat a healthy diet and stay active. You can always lose the weight gained after you quit.  Find ways to improve your mood other than smoking. FOR MORE INFORMATION  www.smokefree.gov  Document Released: 02/15/2001 Document Revised: 07/08/2013 Document Reviewed: 06/02/2011 [employer] Patient Information 2015 [employer]. This information is not intended to replace advice given to you by your health care provider. Make sure you discuss any questions you have with your health care provider.  

## 2014-08-09 ENCOUNTER — Other Ambulatory Visit: Payer: Self-pay | Admitting: Family Medicine

## 2014-09-26 NOTE — Progress Notes (Signed)
REVIEWED.  

## 2015-04-07 ENCOUNTER — Other Ambulatory Visit: Payer: Self-pay | Admitting: Family Medicine

## 2015-04-23 ENCOUNTER — Other Ambulatory Visit: Payer: Self-pay | Admitting: Family Medicine

## 2015-06-29 ENCOUNTER — Telehealth: Payer: Self-pay | Admitting: Family Medicine

## 2015-06-29 ENCOUNTER — Other Ambulatory Visit: Payer: Self-pay | Admitting: Family Medicine

## 2015-06-29 MED ORDER — FLUTICASONE PROPIONATE 50 MCG/ACT NA SUSP: NASAL | Status: DC

## 2015-06-29 NOTE — Telephone Encounter (Signed)
Pt is requesting a refill on flonase.      [place]

## 2015-06-29 NOTE — Telephone Encounter (Signed)
Notified patient med sent to pharmacy.  

## 2015-09-10 ENCOUNTER — Telehealth: Payer: Self-pay | Admitting: Family Medicine

## 2015-09-10 DIAGNOSIS — Z125 Encounter for screening for malignant neoplasm of prostate: Secondary | ICD-10-CM

## 2015-09-10 DIAGNOSIS — I10 Essential (primary) hypertension: Secondary | ICD-10-CM

## 2015-09-10 DIAGNOSIS — E785 Hyperlipidemia, unspecified: Secondary | ICD-10-CM

## 2015-09-10 NOTE — Telephone Encounter (Signed)
Spoke with patient and informed him per Dr.Steve [place] were ordered for upcoming appointment. Patient verbalized understanding.

## 2015-09-10 NOTE — Telephone Encounter (Signed)
Pt is requesting lab orders to be sent over for an upcoming appt. Last labs per epic were: lipid,hepatic,bmp,and psa on 06/06/14.

## 2015-09-10 NOTE — Telephone Encounter (Signed)
same

## 2015-09-16 LAB — LIPID PANEL
CHOL/HDL RATIO: 4.6 ratio (ref 0.0–5.0)
Cholesterol, Total: 196 mg/dL (ref 100–199)
HDL: 43 mg/dL (ref >39)
LDL CALC: 110 mg/dL — AB (ref 0–99)
Triglycerides: 217 mg/dL — ABNORMAL HIGH (ref 0–149)
VLDL Cholesterol Cal: 43 mg/dL — ABNORMAL HIGH (ref 5–40)

## 2015-09-16 LAB — BASIC METABOLIC PANEL
BUN/Creatinine Ratio: 11 (ref 9–20)
BUN: 10 mg/dL (ref 6–24)
CALCIUM: 9.8 mg/dL (ref 8.7–10.2)
CO2: 22 mmol/L (ref 18–29)
Chloride: 99 mmol/L (ref 96–106)
Creatinine, Ser: 0.88 mg/dL (ref 0.76–1.27)
GFR calc non Af Amer: 95 mL/min/{1.73_m2} (ref >59)
GFR, EST AFRICAN AMERICAN: 110 mL/min/{1.73_m2} (ref >59)
Glucose: 84 mg/dL (ref 65–99)
Potassium: 4.3 mmol/L (ref 3.5–5.2)
Sodium: 141 mmol/L (ref 134–144)

## 2015-09-16 LAB — HEPATIC FUNCTION PANEL
ALT: 33 IU/L (ref 0–44)
AST: 32 IU/L (ref 0–40)
Albumin: 4.9 g/dL (ref 3.5–5.5)
Alkaline Phosphatase: 98 IU/L (ref 39–117)
BILIRUBIN TOTAL: 0.6 mg/dL (ref 0.0–1.2)
Bilirubin, Direct: 0.16 mg/dL (ref 0.00–0.40)
Total Protein: 8.1 g/dL (ref 6.0–8.5)

## 2015-09-16 LAB — PSA: Prostate Specific Ag, Serum: 0.5 ng/mL (ref 0.0–4.0)

## 2015-09-17 ENCOUNTER — Ambulatory Visit (INDEPENDENT_AMBULATORY_CARE_PROVIDER_SITE_OTHER): Payer: 59 | Admitting: Family Medicine

## 2015-09-17 ENCOUNTER — Encounter: Payer: Self-pay | Admitting: Family Medicine

## 2015-09-17 VITALS — BP 134/88 | Ht 70.25 in | Wt 172.2 lb

## 2015-09-17 DIAGNOSIS — M7711 Lateral epicondylitis, right elbow: Secondary | ICD-10-CM

## 2015-09-17 DIAGNOSIS — I10 Essential (primary) hypertension: Secondary | ICD-10-CM

## 2015-09-17 DIAGNOSIS — N529 Male erectile dysfunction, unspecified: Secondary | ICD-10-CM

## 2015-09-17 DIAGNOSIS — Z0000 Encounter for general adult medical examination without abnormal findings: Secondary | ICD-10-CM

## 2015-09-17 MED ORDER — TADALAFIL 10 MG PO TABS: 10.0000 mg | ORAL_TABLET | Freq: Every day | ORAL | Status: DC

## 2015-09-17 MED ORDER — FLUTICASONE PROPIONATE 50 MCG/ACT NA SUSP: NASAL | Status: DC

## 2015-09-17 NOTE — Patient Instructions (Addendum)
Results for orders placed or performed in visit on 09/10/15  Lipid panel  Result Value Ref Range   Cholesterol, Total 196 100 - 199 mg/dL   Triglycerides 217 (H) 0 - 149 mg/dL   HDL 43 >39 mg/dL   VLDL Cholesterol Cal 43 (H) 5 - 40 mg/dL   LDL Calculated 110 (H) 0 - 99 mg/dL   Chol/HDL Ratio 4.6 0.0 - 5.0 ratio units  Hepatic function panel  Result Value Ref Range   Total Protein 8.1 6.0 - 8.5 g/dL   Albumin 4.9 3.5 - 5.5 g/dL   Bilirubin Total 0.6 0.0 - 1.2 mg/dL   Bilirubin, Direct 0.16 0.00 - 0.40 mg/dL   Alkaline Phosphatase 98 39 - 117 IU/L   AST 32 0 - 40 IU/L   ALT 33 0 - 44 IU/L  Basic metabolic panel  Result Value Ref Range   Glucose 84 65 - 99 mg/dL   BUN 10 6 - 24 mg/dL   Creatinine, Ser 0.88 0.76 - 1.27 mg/dL   GFR calc non Af Amer 95 >59 mL/min/1.73   GFR calc Af Amer 110 >59 mL/min/1.73   BUN/Creatinine Ratio 11 9 - 20   Sodium 141 134 - 144 mmol/L   Potassium 4.3 3.5 - 5.2 mmol/L   Chloride 99 96 - 106 mmol/L   CO2 22 18 - 29 mmol/L   Calcium 9.8 8.7 - 10.2 mg/dL  PSA  Result Value Ref Range   Prostate Specific Ag, Serum 0.5 0.0 - 4.0 ng/mL   Take two or three aleave twice per day as needed for elbow pain  Forearm velcro strap for tennis elbow can be purchased at [employer]

## 2015-09-17 NOTE — Progress Notes (Signed)
Subjective:    Patient ID: Gary Harrison, male    DOB: [birth year], 60 y.o.   MRN: [id number]  HPI The patient comes in today for a wellness visit.    A review of their health history was completed.  A review of medications was also completed.  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   Additional concerns: none  Exercising a fair amnt, walking some and reg exercise doing pretty well  Results for orders placed or performed in visit on 09/10/15  Lipid panel  Result Value Ref Range   Cholesterol, Total 196 100 - 199 mg/dL   Triglycerides 217 (H) 0 - 149 mg/dL   HDL 43 >39 mg/dL   VLDL Cholesterol Cal 43 (H) 5 - 40 mg/dL   LDL Calculated 110 (H) 0 - 99 mg/dL   Chol/HDL Ratio 4.6 0.0 - 5.0 ratio units  Hepatic function panel  Result Value Ref Range   Total Protein 8.1 6.0 - 8.5 g/dL   Albumin 4.9 3.5 - 5.5 g/dL   Bilirubin Total 0.6 0.0 - 1.2 mg/dL   Bilirubin, Direct 0.16 0.00 - 0.40 mg/dL   Alkaline Phosphatase 98 39 - 117 IU/L   AST 32 0 - 40 IU/L   ALT 33 0 - 44 IU/L  Basic metabolic panel  Result Value Ref Range   Glucose 84 65 - 99 mg/dL   BUN 10 6 - 24 mg/dL   Creatinine, Ser 0.88 0.76 - 1.27 mg/dL   GFR calc non Af Amer 95 >59 mL/min/1.73   GFR calc Af Amer 110 >59 mL/min/1.73   BUN/Creatinine Ratio 11 9 - 20   Sodium 141 134 - 144 mmol/L   Potassium 4.3 3.5 - 5.2 mmol/L   Chloride 99 96 - 106 mmol/L   CO2 22 18 - 29 mmol/L   Calcium 9.8 8.7 - 10.2 mg/dL  PSA  Result Value Ref Range   Prostate Specific Ag, Serum 0.5 0.0 - 4.0 ng/mL   Right lateral elbow pain. Sharp in nature. Worse with certain motions. 2 months duration. No obvious swelling. No sudden injury was doing a lot of excess work with that elbow  Ran out of Flonase. States deftly helps allergies requesting  refills.  Compliant with generic Cialis. States deftly helps directions. Would like refill.  Strained lebow twomo ago  Last colonoscopy- 01/25/2013 Review of Systems  Constitutional: Negative for fever, activity change and appetite change.  HENT: Negative for congestion and rhinorrhea.   Eyes: Negative for discharge.  Respiratory: Negative for cough and wheezing.   Cardiovascular: Negative for chest pain.  Gastrointestinal: Negative for vomiting, abdominal pain and blood in stool.  Genitourinary: Negative for frequency and difficulty urinating.  Musculoskeletal: Negative for neck pain.  Skin: Negative for rash.  Allergic/Immunologic: Negative for environmental allergies and food allergies.  Neurological: Negative for weakness and headaches.  Psychiatric/Behavioral: Negative for agitation.  All other systems reviewed and are negative.      Objective:   Physical Exam  Constitutional: He appears well-developed and well-nourished.  HENT:  Head: Normocephalic and atraumatic.  Right Ear: External ear normal.  Left Ear: External ear normal.  Nose: Nose normal.  Mouth/Throat: Oropharynx is clear and moist.  Eyes: EOM are normal. Pupils are equal, round, and reactive to light.  Neck: Normal range of  motion. Neck supple. No thyromegaly present.  Cardiovascular: Normal rate, regular rhythm and normal heart sounds.   No murmur heard. Pulmonary/Chest: Effort normal and breath sounds normal. No respiratory distress. He has no wheezes.  Abdominal: Soft. Bowel sounds are normal. He exhibits no distension and no mass. There is no tenderness.  Genitourinary: Penis normal.  Prostate within normal limits  Musculoskeletal: Normal range of motion. He exhibits no edema.  Lymphadenopathy:    He has no cervical adenopathy.  Neurological: He is alert. He exhibits normal muscle tone.  Skin: Skin is warm and dry. No erythema.  Psychiatric: He has a normal mood and affect. His behavior is normal.  Judgment normal.   right lateral elbow distinctly tender in epicondyle otherwise good range of motion        Assessment & Plan:  Wellness exam up to date on colonoscopy. Blood work reviewed. #2 lateral epicondylitis discussed menstruation #3 erectile dysfunction #4 allergic rhinitis plan anti-inflammatory medicine Aleve when necessary for elbow. Form strap recommended. Cialis and Flonase refilled. Diet exercise discussed. Hemoccult cards. WSL

## 2015-10-05 ENCOUNTER — Other Ambulatory Visit: Payer: Self-pay | Admitting: *Deleted

## 2015-10-05 MED ORDER — SCOPOLAMINE 1 MG/3DAYS TD PT72: 1.0000 | MEDICATED_PATCH | TRANSDERMAL | 0 refills | Status: DC

## 2016-02-23 ENCOUNTER — Telehealth: Payer: Self-pay | Admitting: Family Medicine

## 2016-02-23 NOTE — Telephone Encounter (Signed)
Please sign Medhansh Spangler Chart for visit 09/17/15. Thank you!

## 2016-08-22 ENCOUNTER — Telehealth: Payer: Self-pay | Admitting: Family Medicine

## 2016-08-22 DIAGNOSIS — I10 Essential (primary) hypertension: Secondary | ICD-10-CM

## 2016-08-22 DIAGNOSIS — Z125 Encounter for screening for malignant neoplasm of prostate: Secondary | ICD-10-CM

## 2016-08-22 DIAGNOSIS — E785 Hyperlipidemia, unspecified: Secondary | ICD-10-CM

## 2016-08-22 NOTE — Telephone Encounter (Signed)
Rep same 

## 2016-08-22 NOTE — Telephone Encounter (Signed)
Pt will need lab orders to be sent over for a wellness visit coming up in a few weeks. Last labs per epic were: psa,bmp,hepatic,and lipid on 09/15/15.

## 2016-08-22 NOTE — Telephone Encounter (Signed)
Spoke with patient and informed him per Dr.Steve Luking- labs ordered. Patient verbalized understanding.

## 2016-08-30 DIAGNOSIS — E785 Hyperlipidemia, unspecified: Secondary | ICD-10-CM | POA: Diagnosis not present

## 2016-08-30 DIAGNOSIS — Z125 Encounter for screening for malignant neoplasm of prostate: Secondary | ICD-10-CM | POA: Diagnosis not present

## 2016-08-30 DIAGNOSIS — I10 Essential (primary) hypertension: Secondary | ICD-10-CM | POA: Diagnosis not present

## 2016-08-31 LAB — LIPID PANEL
CHOL/HDL RATIO: 6.4 ratio — AB (ref 0.0–5.0)
CHOLESTEROL TOTAL: 128 mg/dL (ref 100–199)
HDL: 20 mg/dL — ABNORMAL LOW (ref >39)
LDL Calculated: 45 mg/dL (ref 0–99)
Triglycerides: 314 mg/dL — ABNORMAL HIGH (ref 0–149)
VLDL Cholesterol Cal: 63 mg/dL — ABNORMAL HIGH (ref 5–40)

## 2016-08-31 LAB — BASIC METABOLIC PANEL
BUN/Creatinine Ratio: 8 — ABNORMAL LOW (ref 9–20)
BUN: 7 mg/dL (ref 6–24)
CO2: 24 mmol/L (ref 20–29)
Calcium: 9.2 mg/dL (ref 8.7–10.2)
Chloride: 102 mmol/L (ref 96–106)
Creatinine, Ser: 0.89 mg/dL (ref 0.76–1.27)
GFR calc Af Amer: 109 mL/min/{1.73_m2} (ref >59)
GFR calc non Af Amer: 94 mL/min/{1.73_m2} (ref >59)
Glucose: 87 mg/dL (ref 65–99)
Potassium: 4.1 mmol/L (ref 3.5–5.2)
Sodium: 143 mmol/L (ref 134–144)

## 2016-08-31 LAB — HEPATIC FUNCTION PANEL
ALT: 36 IU/L (ref 0–44)
AST: 32 IU/L (ref 0–40)
Albumin: 4.6 g/dL (ref 3.5–5.5)
Alkaline Phosphatase: 105 IU/L (ref 39–117)
Bilirubin Total: 0.4 mg/dL (ref 0.0–1.2)
Bilirubin, Direct: 0.13 mg/dL (ref 0.00–0.40)
Total Protein: 7.9 g/dL (ref 6.0–8.5)

## 2016-08-31 LAB — PSA: Prostate Specific Ag, Serum: 0.2 ng/mL (ref 0.0–4.0)

## 2016-09-01 ENCOUNTER — Encounter: Payer: Self-pay | Admitting: Family Medicine

## 2016-09-01 ENCOUNTER — Ambulatory Visit (INDEPENDENT_AMBULATORY_CARE_PROVIDER_SITE_OTHER): Payer: 59 | Admitting: Family Medicine

## 2016-09-01 VITALS — BP 122/76 | Temp 97.9°F | Ht 70.25 in | Wt 172.6 lb

## 2016-09-01 DIAGNOSIS — R509 Fever, unspecified: Secondary | ICD-10-CM | POA: Diagnosis not present

## 2016-09-01 MED ORDER — DOXYCYCLINE HYCLATE 100 MG PO TABS: 100.0000 mg | ORAL_TABLET | Freq: Two times a day (BID) | ORAL | 0 refills | Status: DC

## 2016-09-01 NOTE — Progress Notes (Signed)
   Subjective:    Patient ID: Gary Harrison, male    DOB: 08-07-1955, 61 y.o.   MRN: [id number]  Fever   This is a new problem. The current episode started in the past 7 days. Associated symptoms include headaches and muscle aches. Treatments tried: otc cold med.   No sig cough and cong  Headache rough at times  Def fever   Pos tick bite four within the lat month  Works at [employer]  Works outdoors, uses Engineer, maintenance Review of Systems  Constitutional: Positive for fever.  Neurological: Positive for headaches.       Objective:   Physical Exam Alert active good hydration. HEENT normal lungs clear. Heart regular in rhythm. Mild malaise       Assessment & Plan:  Impression febrile illness with several recent tick bites could well be viral but should cover. Discussed. Will initiate doxycycline 100 twice a day 10 days

## 2016-09-21 ENCOUNTER — Encounter: Payer: Self-pay | Admitting: Family Medicine

## 2016-09-21 ENCOUNTER — Ambulatory Visit (INDEPENDENT_AMBULATORY_CARE_PROVIDER_SITE_OTHER): Payer: 59 | Admitting: Family Medicine

## 2016-09-21 VITALS — BP 152/100 | Ht 69.25 in | Wt 171.5 lb

## 2016-09-21 DIAGNOSIS — Z0000 Encounter for general adult medical examination without abnormal findings: Secondary | ICD-10-CM | POA: Diagnosis not present

## 2016-09-21 NOTE — Patient Instructions (Signed)
Results for orders placed or performed in visit on 08/22/16  PSA  Result Value Ref Range   Prostate Specific Ag, Serum 0.2 0.0 - 4.0 ng/mL  Basic metabolic panel  Result Value Ref Range   Glucose 87 65 - 99 mg/dL   BUN 7 6 - 24 mg/dL   Creatinine, Ser 0.89 0.76 - 1.27 mg/dL   GFR calc non Af Amer 94 >59 mL/min/1.73   GFR calc Af Amer 109 >59 mL/min/1.73   BUN/Creatinine Ratio 8 (L) 9 - 20   Sodium 143 134 - 144 mmol/L   Potassium 4.1 3.5 - 5.2 mmol/L   Chloride 102 96 - 106 mmol/L   CO2 24 20 - 29 mmol/L   Calcium 9.2 8.7 - 10.2 mg/dL  Hepatic function panel  Result Value Ref Range   Total Protein 7.9 6.0 - 8.5 g/dL   Albumin 4.6 3.5 - 5.5 g/dL   Bilirubin Total 0.4 0.0 - 1.2 mg/dL   Bilirubin, Direct 0.13 0.00 - 0.40 mg/dL   Alkaline Phosphatase 105 39 - 117 IU/L   AST 32 0 - 40 IU/L   ALT 36 0 - 44 IU/L  Lipid panel  Result Value Ref Range   Cholesterol, Total 128 100 - 199 mg/dL   Triglycerides 314 (H) 0 - 149 mg/dL   HDL 20 (L) >39 mg/dL   VLDL Cholesterol Cal 63 (H) 5 - 40 mg/dL   LDL Calculated 45 0 - 99 mg/dL   Chol/HDL Ratio 6.4 (H) 0.0 - 5.0 ratio

## 2016-09-21 NOTE — Progress Notes (Signed)
Subjective:    Patient ID: Gary Harrison, male    DOB: Dec 09, 1955, 61 y.o.   MRN: [id number]  HPI The patient comes in today for a wellness visit.  Results for orders placed or performed in visit on 08/22/16  PSA  Result Value Ref Range   Prostate Specific Ag, Serum 0.2 0.0 - 4.0 ng/mL  Basic metabolic panel  Result Value Ref Range   Glucose 87 65 - 99 mg/dL   BUN 7 6 - 24 mg/dL   Creatinine, Ser 0.89 0.76 - 1.27 mg/dL   GFR calc non Af Amer 94 >59 mL/min/1.73   GFR calc Af Amer 109 >59 mL/min/1.73   BUN/Creatinine Ratio 8 (L) 9 - 20   Sodium 143 134 - 144 mmol/L   Potassium 4.1 3.5 - 5.2 mmol/L   Chloride 102 96 - 106 mmol/L   CO2 24 20 - 29 mmol/L   Calcium 9.2 8.7 - 10.2 mg/dL  Hepatic function panel  Result Value Ref Range   Total Protein 7.9 6.0 - 8.5 g/dL   Albumin 4.6 3.5 - 5.5 g/dL   Bilirubin Total 0.4 0.0 - 1.2 mg/dL   Bilirubin, Direct 0.13 0.00 - 0.40 mg/dL   Alkaline Phosphatase 105 39 - 117 IU/L   AST 32 0 - 40 IU/L   ALT 36 0 - 44 IU/L  Lipid panel  Result Value Ref Range   Cholesterol, Total 128 100 - 199 mg/dL   Triglycerides 314 (H) 0 - 149 mg/dL   HDL 20 (L) >39 mg/dL   VLDL Cholesterol Cal 63 (H) 5 - 40 mg/dL   LDL Calculated 45 0 - 99 mg/dL   Chol/HDL Ratio 6.4 (H) 0.0 - 5.0 ratio    States overall good diet  Watching bp , watching salt intake   A review of their health history was completed.  A review of medications was also completed.  Any needed refills; None   Eating habits: Patient states eating habits are good.  Falls/  MVA accidents in past few months: None  Regular exercise: Patient states does not exercise but has job that keeps him active. He gardens, and does activities with his dog.  Specialist pt sees on regular basis: None  Preventative health issues were discussed.   Additional concerns: States no concerns this visit.    Review of Systems  Constitutional: Negative for activity change, appetite change and fever.    HENT: Negative for congestion and rhinorrhea.   Eyes: Negative for discharge.  Respiratory: Negative for cough and wheezing.   Cardiovascular: Negative for chest pain.  Gastrointestinal: Negative for abdominal pain, blood in stool and vomiting.  Genitourinary: Negative for difficulty urinating and frequency.  Musculoskeletal: Negative for neck pain.  Skin: Negative for rash.  Allergic/Immunologic: Negative for environmental allergies and food allergies.  Neurological: Negative for weakness and headaches.  Psychiatric/Behavioral: Negative for agitation.  All other systems reviewed and are negative.      Objective:   Physical Exam  Constitutional: He appears well-developed and well-nourished.  HENT:  Head: Normocephalic and atraumatic.  Right Ear: External ear normal.  Left Ear: External ear normal.  Nose: Nose normal.  Mouth/Throat: Oropharynx is clear and moist.  Eyes: Pupils are equal, round, and reactive to light. EOM are normal.  Neck: Normal range of motion. Neck supple. No thyromegaly present.  Cardiovascular: Normal rate, regular rhythm and normal heart sounds.   No murmur heard. Pulmonary/Chest: Effort normal and breath sounds normal. No respiratory distress. He  has no wheezes.  Abdominal: Soft. Bowel sounds are normal. He exhibits no distension and no mass. There is no tenderness.  Genitourinary: Penis normal.  Musculoskeletal: Normal range of motion. He exhibits no edema.  Lymphadenopathy:    He has no cervical adenopathy.  Neurological: He is alert. He exhibits normal muscle tone.  Skin: Skin is warm and dry. No erythema.  Psychiatric: He has a normal mood and affect. His behavior is normal. Judgment normal.  Vitals reviewed.         Assessment & Plan:  Impression well adult exam. #2 hypertension. Has done well off medications for the past year and a half but today numbers are elevated. Plan diet discussed exercise discussed. Up-to-date on colonoscopy. Patient  to check blood pressure couple times a month to work place if remain elevated call us. Blood work discussed

## 2016-09-25 ENCOUNTER — Other Ambulatory Visit: Payer: Self-pay | Admitting: Family Medicine

## 2016-10-03 ENCOUNTER — Encounter: Payer: Self-pay | Admitting: Family Medicine

## 2016-10-03 ENCOUNTER — Ambulatory Visit (INDEPENDENT_AMBULATORY_CARE_PROVIDER_SITE_OTHER): Payer: 59 | Admitting: Family Medicine

## 2016-10-03 VITALS — BP 150/92 | Temp 98.4°F | Ht 69.25 in | Wt 171.0 lb

## 2016-10-03 DIAGNOSIS — I10 Essential (primary) hypertension: Secondary | ICD-10-CM

## 2016-10-03 MED ORDER — AMOXICILLIN 500 MG PO TABS: 500.0000 mg | ORAL_TABLET | Freq: Three times a day (TID) | ORAL | 0 refills | Status: DC

## 2016-10-03 MED ORDER — AMLODIPINE BESYLATE 2.5 MG PO TABS: 2.5000 mg | ORAL_TABLET | Freq: Every day | ORAL | 3 refills | Status: DC

## 2016-10-03 NOTE — Patient Instructions (Signed)
DASH Eating Plan DASH stands for "Dietary Approaches to Stop Hypertension." The DASH eating plan is a healthy eating plan that has been shown to reduce high blood pressure (hypertension). It may also reduce your risk for type 2 diabetes, heart disease, and stroke. The DASH eating plan may also help with weight loss. What are tips for following this plan? General guidelines  Avoid eating more than 2,300 mg (milligrams) of salt (sodium) a day. If you have hypertension, you may need to reduce your sodium intake to 1,500 mg a day.  Limit alcohol intake to no more than 1 drink a day for nonpregnant women and 2 drinks a day for men. One drink equals 12 oz of beer, 5 oz of wine, or 1 oz of hard liquor.  Work with your health care provider to maintain a healthy body weight or to lose weight. Ask what an ideal weight is for you.  Get at least 30 minutes of exercise that causes your heart to beat faster (aerobic exercise) most days of the week. Activities may include walking, swimming, or biking.  Work with your health care provider or diet and nutrition specialist (dietitian) to adjust your eating plan to your individual calorie needs. Reading food labels  Check food labels for the amount of sodium per serving. Choose foods with less than 5 percent of the Daily Value of sodium. Generally, foods with less than 300 mg of sodium per serving fit into this eating plan.  To find whole grains, look for the word "whole" as the first word in the ingredient list. Shopping  Buy products labeled as "low-sodium" or "no salt added."  Buy fresh foods. Avoid canned foods and premade or frozen meals. Cooking  Avoid adding salt when cooking. Use salt-free seasonings or herbs instead of table salt or sea salt. Check with your health care provider or pharmacist before using salt substitutes.  Do not fry foods. Cook foods using healthy methods such as baking, boiling, grilling, and broiling instead.  Cook with  heart-healthy oils, such as olive, canola, soybean, or sunflower oil. Meal planning   Eat a balanced diet that includes: ? 5 or more servings of fruits and vegetables each day. At each meal, try to fill half of your plate with fruits and vegetables. ? Up to 6-8 servings of whole grains each day. ? Less than 6 oz of lean meat, poultry, or fish each day. A 3-oz serving of meat is about the same size as a deck of cards. One egg equals 1 oz. ? 2 servings of low-fat dairy each day. ? A serving of nuts, seeds, or beans 5 times each week. ? Heart-healthy fats. Healthy fats called Omega-3 fatty acids are found in foods such as flaxseeds and coldwater fish, like sardines, salmon, and mackerel.  Limit how much you eat of the following: ? Canned or prepackaged foods. ? Food that is high in trans fat, such as fried foods. ? Food that is high in saturated fat, such as fatty meat. ? Sweets, desserts, sugary drinks, and other foods with added sugar. ? Full-fat dairy products.  Do not salt foods before eating.  Try to eat at least 2 vegetarian meals each week.  Eat more home-cooked food and less restaurant, buffet, and fast food.  When eating at a restaurant, ask that your food be prepared with less salt or no salt, if possible. What foods are recommended? The items listed may not be a complete list. Talk with your dietitian about what   dietary choices are best for you. Grains Whole-grain or whole-wheat bread. Whole-grain or whole-wheat pasta. Brown rice. Oatmeal. Quinoa. Bulgur. Whole-grain and low-sodium cereals. Pita bread. Low-fat, low-sodium crackers. Whole-wheat flour tortillas. Vegetables Fresh or frozen vegetables (raw, steamed, roasted, or grilled). Low-sodium or reduced-sodium tomato and vegetable juice. Low-sodium or reduced-sodium tomato sauce and tomato paste. Low-sodium or reduced-sodium canned vegetables. Fruits All fresh, dried, or frozen fruit. Canned fruit in natural juice (without  added sugar). Meat and other protein foods Skinless chicken or turkey. Ground chicken or turkey. Pork with fat trimmed off. Fish and seafood. Egg whites. Dried beans, peas, or lentils. Unsalted nuts, nut butters, and seeds. Unsalted canned beans. Lean cuts of beef with fat trimmed off. Low-sodium, lean deli meat. Dairy Low-fat (1%) or fat-free (skim) milk. Fat-free, low-fat, or reduced-fat cheeses. Nonfat, low-sodium ricotta or cottage cheese. Low-fat or nonfat yogurt. Low-fat, low-sodium cheese. Fats and oils Soft margarine without trans fats. Vegetable oil. Low-fat, reduced-fat, or light mayonnaise and salad dressings (reduced-sodium). Canola, safflower, olive, soybean, and sunflower oils. Avocado. Seasoning and other foods Herbs. Spices. Seasoning mixes without salt. Unsalted popcorn and pretzels. Fat-free sweets. What foods are not recommended? The items listed may not be a complete list. Talk with your dietitian about what dietary choices are best for you. Grains Baked goods made with fat, such as croissants, muffins, or some breads. Dry pasta or rice meal packs. Vegetables Creamed or fried vegetables. Vegetables in a cheese sauce. Regular canned vegetables (not low-sodium or reduced-sodium). Regular canned tomato sauce and paste (not low-sodium or reduced-sodium). Regular tomato and vegetable juice (not low-sodium or reduced-sodium). Pickles. Olives. Fruits Canned fruit in a light or heavy syrup. Fried fruit. Fruit in cream or butter sauce. Meat and other protein foods Fatty cuts of meat. Ribs. Fried meat. Bacon. Sausage. Bologna and other processed lunch meats. Salami. Fatback. Hotdogs. Bratwurst. Salted nuts and seeds. Canned beans with added salt. Canned or smoked fish. Whole eggs or egg yolks. Chicken or turkey with skin. Dairy Whole or 2% milk, cream, and half-and-half. Whole or full-fat cream cheese. Whole-fat or sweetened yogurt. Full-fat cheese. Nondairy creamers. Whipped toppings.  Processed cheese and cheese spreads. Fats and oils Butter. Stick margarine. Lard. Shortening. Ghee. Bacon fat. Tropical oils, such as coconut, palm kernel, or palm oil. Seasoning and other foods Salted popcorn and pretzels. Onion salt, garlic salt, seasoned salt, table salt, and sea salt. Worcestershire sauce. Tartar sauce. Barbecue sauce. Teriyaki sauce. Soy sauce, including reduced-sodium. Steak sauce. Canned and packaged gravies. Fish sauce. Oyster sauce. Cocktail sauce. Horseradish that you find on the shelf. Ketchup. Mustard. Meat flavorings and tenderizers. Bouillon cubes. Hot sauce and Tabasco sauce. Premade or packaged marinades. Premade or packaged taco seasonings. Relishes. Regular salad dressings. Where to find more information:  National Heart, Lung, and Blood Institute: www.nhlbi.nih.gov  American Heart Association: www.heart.org Summary  The DASH eating plan is a healthy eating plan that has been shown to reduce high blood pressure (hypertension). It may also reduce your risk for type 2 diabetes, heart disease, and stroke.  With the DASH eating plan, you should limit salt (sodium) intake to 2,300 mg a day. If you have hypertension, you may need to reduce your sodium intake to 1,500 mg a day.  When on the DASH eating plan, aim to eat more fresh fruits and vegetables, whole grains, lean proteins, low-fat dairy, and heart-healthy fats.  Work with your health care provider or diet and nutrition specialist (dietitian) to adjust your eating plan to your individual   calorie needs. This information is not intended to replace advice given to you by your health care provider. Make sure you discuss any questions you have with your health care provider. Document Released: 02/10/2011 Document Revised: 02/15/2016 Document Reviewed: 02/15/2016 Elsevier Interactive Patient Education  2017 [employer] Inc.  

## 2016-10-03 NOTE — Progress Notes (Addendum)
   Subjective:    Patient ID: Gary Harrison, male    DOB: [birth year], 61 y.o.   MRN: [id number]  Hypertension  This is a chronic problem. The current episode started 1 to 4 weeks ago.  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 States he has headaches regularly.  Has been trying to eat healthy and drink vinegar. Review of Systems See above. No fevers    Objective:   Physical Exam Lungs are clear hearts regular pulse normal BP moderately elevated extremities no edema skin warm dry       Assessment & Plan:  HTN Subpar control *Amlodipine 2.5 mg daily Follow-up office visit in 4 months Healthy eating recommended  Courtesy blood pressure check in 2 weeks' time with nurses, patient states he is unable to afford frequent copayment  Patient was instructed should his blood pressure go up or become dramatically worse or any other problems follow-up immediately

## 2016-10-18 ENCOUNTER — Other Ambulatory Visit: Payer: Self-pay | Admitting: *Deleted

## 2016-10-18 MED ORDER — AMLODIPINE BESYLATE 5 MG PO TABS: 5.0000 mg | ORAL_TABLET | Freq: Every day | ORAL | 3 refills | Status: DC

## 2016-11-11 DIAGNOSIS — T7840XA Allergy, unspecified, initial encounter: Secondary | ICD-10-CM | POA: Diagnosis not present

## 2016-11-16 ENCOUNTER — Telehealth: Payer: Self-pay | Admitting: Family Medicine

## 2016-11-16 NOTE — Telephone Encounter (Signed)
Patient made aware.

## 2016-11-16 NOTE — Telephone Encounter (Signed)
Requesting Rx for Epi pen to [facility].

## 2016-11-16 NOTE — Telephone Encounter (Signed)
Patient went to the urgent care the other day for the bee sting and was given prednisone.  He wants to know if having Rx for prednisone on hand would be an option for him just in case this happened again, if the epi pen is too expensive.

## 2016-11-16 NOTE — Telephone Encounter (Signed)
The purpose of the EpiPen is in case he had a life-threatening emergency it could save his life. A prescription of prednisone having on hand does nothing for life-threatening issue. The patient should check with his insurance company and possibly [facility] to find the cheapest source of EpiPen

## 2017-01-17 ENCOUNTER — Ambulatory Visit: Payer: 59 | Admitting: Family Medicine

## 2017-01-17 ENCOUNTER — Encounter: Payer: Self-pay | Admitting: Family Medicine

## 2017-01-17 VITALS — BP 158/90 | Temp 99.1°F | Ht 69.0 in | Wt 176.0 lb

## 2017-01-17 DIAGNOSIS — J329 Chronic sinusitis, unspecified: Secondary | ICD-10-CM

## 2017-01-17 DIAGNOSIS — J208 Acute bronchitis due to other specified organisms: Secondary | ICD-10-CM

## 2017-01-17 DIAGNOSIS — J209 Acute bronchitis, unspecified: Secondary | ICD-10-CM

## 2017-01-17 MED ORDER — AMOXICILLIN-POT CLAVULANATE 875-125 MG PO TABS: ORAL_TABLET | ORAL | 0 refills | Status: DC

## 2017-01-17 NOTE — Progress Notes (Signed)
   Subjective:    Patient ID: Gary Harrison, male    DOB: [birth year], 61 y.o.   MRN: [id number]  Sinusitis  This is a new problem. Episode onset: 2 weeks. Associated symptoms include congestion and coughing. (Wheezing) Past treatments include oral decongestants.   Pos cong and cough  Thick and congested  Trouble with not going away   Took two bottles of robitussin and also mucinex  Not going away     Review of Systems  HENT: Positive for congestion.   Respiratory: Positive for cough.        Objective:   Physical Exam  Alert, mild malaise. Hydration good Vitals stable. frontal/ maxillary tenderness evident positive nasal congestion. pharynx normal neck supple  lungs clear/no crackles or wheezes. heart regular in rhythm       Assessment & Plan:  Impression rhinosinusitis/bronchitis with possible element of reactive airways patient declines encouraged to stop smoking.  Likely post viral, discussed with patient. plan antibiotics prescribed. Questions answered. Symptomatic care discussed. warning signs discussed. WSL

## 2017-02-14 ENCOUNTER — Other Ambulatory Visit: Payer: Self-pay | Admitting: Family Medicine

## 2017-03-18 ENCOUNTER — Other Ambulatory Visit: Payer: Self-pay | Admitting: Family Medicine

## 2017-04-28 ENCOUNTER — Other Ambulatory Visit: Payer: Self-pay | Admitting: Family Medicine

## 2017-04-28 ENCOUNTER — Telehealth: Payer: Self-pay | Admitting: Family Medicine

## 2017-04-28 MED ORDER — TADALAFIL 10 MG PO TABS: 10.0000 mg | ORAL_TABLET | Freq: Every day | ORAL | 0 refills | Status: DC

## 2017-04-28 MED ORDER — AMLODIPINE BESYLATE 5 MG PO TABS: 5.0000 mg | ORAL_TABLET | Freq: Every day | ORAL | 0 refills | Status: DC

## 2017-04-28 NOTE — Telephone Encounter (Signed)
Pt is needing refills on tadalafil (CIALIS) 10 MG tablet amLODipine (NORVASC) 5 MG tablet   [facility] [address]

## 2017-04-28 NOTE — Telephone Encounter (Signed)
30 d olnly both, sched o v

## 2017-04-28 NOTE — Telephone Encounter (Signed)
Last check up July 2018

## 2017-04-28 NOTE — Telephone Encounter (Signed)
Prescriptions sent electronically to pharmacy. Patient notified and scheduled follow up office visit

## 2017-05-30 ENCOUNTER — Encounter: Payer: Self-pay | Admitting: Family Medicine

## 2017-05-30 ENCOUNTER — Ambulatory Visit: Payer: 59 | Admitting: Family Medicine

## 2017-05-30 VITALS — BP 130/82 | Ht 69.0 in | Wt 184.4 lb

## 2017-05-30 DIAGNOSIS — I10 Essential (primary) hypertension: Secondary | ICD-10-CM | POA: Diagnosis not present

## 2017-05-30 MED ORDER — AMLODIPINE BESYLATE 5 MG PO TABS: 5.0000 mg | ORAL_TABLET | Freq: Every day | ORAL | 5 refills | Status: DC

## 2017-05-30 MED ORDER — FLUTICASONE PROPIONATE 50 MCG/ACT NA SUSP: NASAL | 5 refills | Status: DC

## 2017-05-30 MED ORDER — TADALAFIL 10 MG PO TABS: 10.0000 mg | ORAL_TABLET | Freq: Every day | ORAL | 5 refills | Status: DC

## 2017-05-30 NOTE — Progress Notes (Signed)
   Subjective:    Patient ID: Gary Harrison, male    DOB: 30-Mar-1955, 62 y.o.   MRN: [id number]  Hypertension  This is a chronic problem. The current episode started more than 1 year ago. Risk factors for coronary artery disease include male gender. Treatments tried: norvasc. There are no compliance problems.      Blood pressure medicine and blood pressure levels reviewed today with patient. Compliant with blood pressure medicine. States does not miss a dose. No obvious side effects. Blood pressure generally good when checked elsewhere. Watching salt intake.      Review of Systems No headache, no major weight loss or weight gain, no chest pain no back pain abdominal pain no change in bowel habits complete ROS otherwise negative     Objective:   Physical Exam  Alert vitals stable, NAD. Blood pressure good on repeat. HEENT normal. Lungs clear. Heart regular rate and rhythm.       Assessment & Plan:  Impression hypertension good control discussed maintain same meds diet exercise discussed.  All medications refilled follow-up this summer for wellness plus chronic visit

## 2017-07-21 ENCOUNTER — Ambulatory Visit: Payer: 59 | Admitting: Family Medicine

## 2017-07-21 ENCOUNTER — Encounter: Payer: Self-pay | Admitting: Family Medicine

## 2017-07-21 VITALS — BP 162/100 | Temp 98.8°F | Ht 69.0 in | Wt 178.0 lb

## 2017-07-21 DIAGNOSIS — I10 Essential (primary) hypertension: Secondary | ICD-10-CM

## 2017-07-21 DIAGNOSIS — R110 Nausea: Secondary | ICD-10-CM

## 2017-07-21 DIAGNOSIS — N200 Calculus of kidney: Secondary | ICD-10-CM

## 2017-07-21 DIAGNOSIS — R300 Dysuria: Secondary | ICD-10-CM

## 2017-07-21 LAB — POCT URINALYSIS DIPSTICK
PH UA: 5 (ref 5.0–8.0)
Spec Grav, UA: >=1.03 — AB (ref 1.010–1.025)

## 2017-07-21 MED ORDER — HYDROCODONE-ACETAMINOPHEN 5-325 MG PO TABS: ORAL_TABLET | ORAL | 0 refills | Status: DC

## 2017-07-21 MED ORDER — TAMSULOSIN HCL 0.4 MG PO CAPS: 0.4000 mg | ORAL_CAPSULE | Freq: Every day | ORAL | 0 refills | Status: DC

## 2017-07-21 MED ORDER — ONDANSETRON 4 MG PO TBDP: 4.0000 mg | ORAL_TABLET | Freq: Four times a day (QID) | ORAL | 0 refills | Status: DC | PRN

## 2017-07-21 MED ORDER — PROMETHAZINE HCL 25 MG/ML IJ SOLN
25.0000 mg | Freq: Once | INTRAMUSCULAR | Status: AC
  Administered 2017-07-21: 25 mg via INTRAMUSCULAR

## 2017-07-21 NOTE — Progress Notes (Signed)
   Subjective:    Patient ID: Gary Harrison, male    DOB: Oct 23, 1955, 62 y.o.   MRN: [id number]  HPI  Patient is here today with complaints of a possible uti,also vomiting.Patient states he has been having dark urine and right sided upper abd pain. He states he started having the symptoms two days ago.He has been vomiting violently. Has had a history of kidney stones and the pain was the same,but did not have any vomiting at the time.  Review of Systems Results for orders placed or performed in visit on 07/21/17  POCT urinalysis dipstick  Result Value Ref Range   Color, UA     Clarity, UA     Glucose, UA     Bilirubin, UA     Ketones, UA     Spec Grav, UA >=1.030 (A) 1.010 - 1.025   Blood, UA Moderate    pH, UA 5.0 5.0 - 8.0   Protein, UA     Urobilinogen, UA  0.2 or 1.0 E.U./dL   Nitrite, UA     Leukocytes, UA Trace (A) Negative   Appearance     Odor     Having trouble finding a positon to help the pain  Took pepto bismol did not help   Many yrs ago had stone, at least fifteen yrs   1 pain is primarily in the right flank.  Extending into the right lateral abdomen.  Positive severe at times.  Patient moving around a lot with the pain.  Also some dysuria noted.  Also noted an element of hematuria.  No fever no chills no headache or chest pain  No true CVA tenderness testing 123 testing  No headache, no major weight loss or weight gain, no chest pain no back pain abdominal pain no change in bowel habits complete ROS otherwise negative     Objective:   Physical Exam Alert and oriented, vitals reviewed and stable, NAD ENT-TM's and ext canals WNL bilat via otoscopic exam Soft palate, tonsils and post pharynx WNL via oropharyngeal exam Neck-symmetric, no masses; thyroid nonpalpable and nontender Pulmonary-no tachypnea or accessory muscle use; Clear without wheezes via auscultation Card--no abnrml murmurs, rhythm reg and rate WNL Carotid pulses symmetric, without  bruits No true CVA tenderness1 abdomen right lateral tenderness to deep palpation bowel sounds intact  Urinalysis numerous red blood cells rare white blood cell impression       Assessment & Plan:  1 impression impression impressionii probable kidney stone.  Discussed at length.  Warning signs discussed.  Initiate anti-inflammatory medicines.  Hydrate.  Phenergan injection today due to active vomiting.  Warning signs discussed carefully.  There is a chance patient may need to go to emergency room.  Strain urine.  Bring stone in for analysis rationale discussed long discussion held  Greater than 50% of this 25 minute face to face visit was spent in counseling and discussion and coordination of care regarding the above diagnosis/diagnosies

## 2017-08-21 ENCOUNTER — Ambulatory Visit: Payer: 59 | Admitting: Family Medicine

## 2017-08-21 ENCOUNTER — Telehealth: Payer: Self-pay | Admitting: Family Medicine

## 2017-08-21 DIAGNOSIS — Z13220 Encounter for screening for lipoid disorders: Secondary | ICD-10-CM

## 2017-08-21 DIAGNOSIS — Z79899 Other long term (current) drug therapy: Secondary | ICD-10-CM

## 2017-08-21 DIAGNOSIS — Z125 Encounter for screening for malignant neoplasm of prostate: Secondary | ICD-10-CM

## 2017-08-21 DIAGNOSIS — I10 Essential (primary) hypertension: Secondary | ICD-10-CM

## 2017-08-21 NOTE — Telephone Encounter (Signed)
Patient has an appointment on 09/21/17 with Dr. Richardson Landry. He wants to know if he is due for labs?

## 2017-08-21 NOTE — Telephone Encounter (Signed)
Patient last had labs drawn 08/30/2016 Lipid,Hep,Bmet,Psa.Please advise.

## 2017-08-21 NOTE — Telephone Encounter (Signed)
Rep same 

## 2017-08-21 NOTE — Telephone Encounter (Signed)
Patient is aware.Orders placed. 

## 2017-08-25 ENCOUNTER — Encounter: Payer: Self-pay | Admitting: Family Medicine

## 2017-08-25 ENCOUNTER — Ambulatory Visit: Payer: 59 | Admitting: Family Medicine

## 2017-08-25 VITALS — BP 122/84 | Ht 69.0 in | Wt 177.6 lb

## 2017-08-25 DIAGNOSIS — S29019A Strain of muscle and tendon of unspecified wall of thorax, initial encounter: Secondary | ICD-10-CM | POA: Diagnosis not present

## 2017-08-25 MED ORDER — ETODOLAC 400 MG PO TABS: ORAL_TABLET | ORAL | 1 refills | Status: DC

## 2017-08-25 NOTE — Progress Notes (Signed)
   Subjective:    Patient ID: Gary Harrison, male    DOB: March 26, 1955, 62 y.o.   MRN: [id number]  HPI  Patient arrives for a follow up on kidney stone. Patient states the pain is better and he believes he passed it but never saw the stone.pt had similar episode of prior kid stone, now pain is virtually gone  Patient also having upper back pain since cutting up and hauling off a tree.lifted three trailer load of wood, hard catch in the upper mid back  Pain worse with certain motions.  Primarily upper left posterior back.  No headache  Review of Systems No chest pain no shortness of breath    Objective:   Physical Exam   Alert vitals stable, NAD. Blood pressure good on repeat. HEENT normal. Lungs clear. Heart regular rate and rhythm.   No CVA tenderness/positive suprascapular tenderness left side.     Assessment & Plan:  Impression1 thoracic strain.  Anti-inflammatory medicine prescribed.  Range of motion exercises discussed.  At this point muscle spasm medicine will not do much discussed

## 2017-09-18 DIAGNOSIS — I10 Essential (primary) hypertension: Secondary | ICD-10-CM | POA: Diagnosis not present

## 2017-09-18 DIAGNOSIS — Z13220 Encounter for screening for lipoid disorders: Secondary | ICD-10-CM | POA: Diagnosis not present

## 2017-09-18 DIAGNOSIS — Z79899 Other long term (current) drug therapy: Secondary | ICD-10-CM | POA: Diagnosis not present

## 2017-09-19 LAB — BASIC METABOLIC PANEL
BUN/Creatinine Ratio: 10 (ref 10–24)
BUN: 9 mg/dL (ref 8–27)
CALCIUM: 9.6 mg/dL (ref 8.6–10.2)
CHLORIDE: 103 mmol/L (ref 96–106)
CO2: 20 mmol/L (ref 20–29)
Creatinine, Ser: 0.91 mg/dL (ref 0.76–1.27)
GFR calc non Af Amer: 91 mL/min/{1.73_m2} (ref >59)
GFR, EST AFRICAN AMERICAN: 106 mL/min/{1.73_m2} (ref >59)
GLUCOSE: 86 mg/dL (ref 65–99)
POTASSIUM: 4.2 mmol/L (ref 3.5–5.2)
Sodium: 143 mmol/L (ref 134–144)

## 2017-09-19 LAB — LIPID PANEL
CHOL/HDL RATIO: 5.1 ratio — AB (ref 0.0–5.0)
CHOLESTEROL TOTAL: 157 mg/dL (ref 100–199)
HDL: 31 mg/dL — ABNORMAL LOW (ref >39)
LDL Calculated: 84 mg/dL (ref 0–99)
TRIGLYCERIDES: 210 mg/dL — AB (ref 0–149)
VLDL CHOLESTEROL CAL: 42 mg/dL — AB (ref 5–40)

## 2017-09-19 LAB — HEPATIC FUNCTION PANEL
ALT: 38 IU/L (ref 0–44)
AST: 27 IU/L (ref 0–40)
Albumin: 4.8 g/dL (ref 3.6–4.8)
Alkaline Phosphatase: 98 IU/L (ref 39–117)
BILIRUBIN TOTAL: 0.5 mg/dL (ref 0.0–1.2)
Bilirubin, Direct: 0.16 mg/dL (ref 0.00–0.40)
Total Protein: 7.8 g/dL (ref 6.0–8.5)

## 2017-09-19 LAB — PSA: PROSTATE SPECIFIC AG, SERUM: 0.4 ng/mL (ref 0.0–4.0)

## 2017-09-21 ENCOUNTER — Encounter: Payer: Self-pay | Admitting: Family Medicine

## 2017-09-21 ENCOUNTER — Ambulatory Visit (INDEPENDENT_AMBULATORY_CARE_PROVIDER_SITE_OTHER): Payer: 59 | Admitting: Family Medicine

## 2017-09-21 VITALS — BP 134/90 | Ht 70.75 in | Wt 177.0 lb

## 2017-09-21 DIAGNOSIS — Z23 Encounter for immunization: Secondary | ICD-10-CM | POA: Diagnosis not present

## 2017-09-21 DIAGNOSIS — Z1211 Encounter for screening for malignant neoplasm of colon: Secondary | ICD-10-CM | POA: Diagnosis not present

## 2017-09-21 DIAGNOSIS — Z0000 Encounter for general adult medical examination without abnormal findings: Secondary | ICD-10-CM

## 2017-09-21 MED ORDER — ZOSTER VAC RECOMB ADJUVANTED 50 MCG/0.5ML IM SUSR: 0.5000 mL | Freq: Once | INTRAMUSCULAR | 1 refills | Status: AC

## 2017-09-21 NOTE — Patient Instructions (Signed)
Steps to Quit Smoking Smoking tobacco can be harmful to your health and can affect almost every organ in your body. Smoking puts you, and those around you, at risk for developing many serious chronic diseases. Quitting smoking is difficult, but it is one of the best things that you can do for your health. It is never too late to quit. What are the benefits of quitting smoking? When you quit smoking, you lower your risk of developing serious diseases and conditions, such as:  Lung cancer or lung disease, such as COPD.  Heart disease.  Stroke.  Heart attack.  Infertility.  Osteoporosis and bone fractures.  Additionally, symptoms such as coughing, wheezing, and shortness of breath may get better when you quit. You may also find that you get sick less often because your body is stronger at fighting off colds and infections. If you are pregnant, quitting smoking can help to reduce your chances of having a baby of low birth weight. How do I get ready to quit? When you decide to quit smoking, create a plan to make sure that you are successful. Before you quit:  Pick a date to quit. Set a date within the next two weeks to give you time to prepare.  Write down the reasons why you are quitting. Keep this list in places where you will see it often, such as on your bathroom mirror or in your car or wallet.  Identify the people, places, things, and activities that make you want to smoke (triggers) and avoid them. Make sure to take these actions: ? Throw away all cigarettes at home, at work, and in your car. ? Throw away smoking accessories, such as ashtrays and lighters. ? Clean your car and make sure to empty the ashtray. ? Clean your home, including curtains and carpets.  Tell your family, friends, and coworkers that you are quitting. Support from your loved ones can make quitting easier.  Talk with your health care provider about your options for quitting smoking.  Find out what treatment  options are covered by your health insurance.  What strategies can I use to quit smoking? Talk with your healthcare provider about different strategies to quit smoking. Some strategies include:  Quitting smoking altogether instead of gradually lessening how much you smoke over a period of time. Research shows that quitting "cold turkey" is more successful than gradually quitting.  Attending in-person counseling to help you build problem-solving skills. You are more likely to have success in quitting if you attend several counseling sessions. Even short sessions of 10 minutes can be effective.  Finding resources and support systems that can help you to quit smoking and remain smoke-free after you quit. These resources are most helpful when you use them often. They can include: ? Online chats with a counselor. ? Telephone quitlines. ? Printed self-help materials. ? Support groups or group counseling. ? Text messaging programs. ? Mobile phone applications.  Taking medicines to help you quit smoking. (If you are pregnant or breastfeeding, talk with your health care provider first.) Some medicines contain nicotine and some do not. Both types of medicines help with cravings, but the medicines that include nicotine help to relieve withdrawal symptoms. Your health care provider may recommend: ? Nicotine patches, gum, or lozenges. ? Nicotine inhalers or sprays. ? Non-nicotine medicine that is taken by mouth.  Talk with your health care provider about combining strategies, such as taking medicines while you are also receiving in-person counseling. Using these two strategies together   makes you more likely to succeed in quitting than if you used either strategy on its own. If you are pregnant or breastfeeding, talk with your health care provider about finding counseling or other support strategies to quit smoking. Do not take medicine to help you quit smoking unless told to do so by your health care  provider. What things can I do to make it easier to quit? Quitting smoking might feel overwhelming at first, but there is a lot that you can do to make it easier. Take these important actions:  Reach out to your family and friends and ask that they support and encourage you during this time. Call telephone quitlines, reach out to support groups, or work with a counselor for support.  Ask people who smoke to avoid smoking around you.  Avoid places that trigger you to smoke, such as bars, parties, or smoke-break areas at work.  Spend time around people who do not smoke.  Lessen stress in your life, because stress can be a smoking trigger for some people. To lessen stress, try: ? Exercising regularly. ? Deep-breathing exercises. ? Yoga. ? Meditating. ? Performing a body scan. This involves closing your eyes, scanning your body from head to toe, and noticing which parts of your body are particularly tense. Purposefully relax the muscles in those areas.  Download or purchase mobile phone or tablet apps (applications) that can help you stick to your quit plan by providing reminders, tips, and encouragement. There are many free apps, such as QuitGuide from the CDC (Centers for Disease Control and Prevention). You can find other support for quitting smoking (smoking cessation) through smokefree.gov and other websites.  How will I feel when I quit smoking? Within the first 24 hours of quitting smoking, you may start to feel some withdrawal symptoms. These symptoms are usually most noticeable 2-3 days after quitting, but they usually do not last beyond 2-3 weeks. Changes or symptoms that you might experience include:  Mood swings.  Restlessness, anxiety, or irritation.  Difficulty concentrating.  Dizziness.  Strong cravings for sugary foods in addition to nicotine.  Mild weight gain.  Constipation.  Nausea.  Coughing or a sore throat.  Changes in how your medicines work in your  body.  A depressed mood.  Difficulty sleeping (insomnia).  After the first 2-3 weeks of quitting, you may start to notice more positive results, such as:  Improved sense of smell and taste.  Decreased coughing and sore throat.  Slower heart rate.  Lower blood pressure.  Clearer skin.  The ability to breathe more easily.  Fewer sick days.  Quitting smoking is very challenging for most people. Do not get discouraged if you are not successful the first time. Some people need to make many attempts to quit before they achieve long-term success. Do your best to stick to your quit plan, and talk with your health care provider if you have any questions or concerns. This information is not intended to replace advice given to you by your health care provider. Make sure you discuss any questions you have with your health care provider. Document Released: 02/15/2001 Document Revised: 10/20/2015 Document Reviewed: 07/08/2014 Elsevier Interactive Patient Education  2018 Elsevier Inc.  

## 2017-09-21 NOTE — Progress Notes (Signed)
Subjective:    Patient ID: Gary Harrison, male    DOB: 1955/04/05, 62 y.o.   MRN: [id number]  HPI The patient comes in today for a wellness visit.    A review of their health history was completed.  A review of medications was also completed.  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   Additional concerns: none  Results for orders placed or performed in visit on 08/21/17  Lipid panel  Result Value Ref Range   Cholesterol, Total 157 100 - 199 mg/dL   Triglycerides 210 (H) 0 - 149 mg/dL   HDL 31 (L) >39 mg/dL   VLDL Cholesterol Cal 42 (H) 5 - 40 mg/dL   LDL Calculated 84 0 - 99 mg/dL   Chol/HDL Ratio 5.1 (H) 0.0 - 5.0 ratio  Hepatic function panel  Result Value Ref Range   Total Protein 7.8 6.0 - 8.5 g/dL   Albumin 4.8 3.6 - 4.8 g/dL   Bilirubin Total 0.5 0.0 - 1.2 mg/dL   Bilirubin, Direct 0.16 0.00 - 0.40 mg/dL   Alkaline Phosphatase 98 39 - 117 IU/L   AST 27 0 - 40 IU/L   ALT 38 0 - 44 IU/L  Basic metabolic panel  Result Value Ref Range   Glucose 86 65 - 99 mg/dL   BUN 9 8 - 27 mg/dL   Creatinine, Ser 0.91 0.76 - 1.27 mg/dL   GFR calc non Af Amer 91 >59 mL/min/1.73   GFR calc Af Amer 106 >59 mL/min/1.73   BUN/Creatinine Ratio 10 10 - 24   Sodium 143 134 - 144 mmol/L   Potassium 4.2 3.5 - 5.2 mmol/L   Chloride 103 96 - 106 mmol/L   CO2 20 20 - 29 mmol/L   Calcium 9.6 8.6 - 10.2 mg/dL  PSA  Result Value Ref Range   Prostate Specific Ag, Serum 0.4 0.0 - 4.0 ng/mL   Fair amnt exdrcise with job  Smoking one half pk per day                                                                Started at age `3 or so    Blood pressure medicine and blood pressure levels reviewed today with patient. Compliant with blood  pressure medicine. States does not miss a dose. No obvious side effects. Blood pressure generally good when checked elsewhere. Watching salt intake.     Review of Systems  Constitutional: Negative for activity change, appetite change and fever.  HENT: Negative for congestion and rhinorrhea.   Eyes: Negative for discharge.  Respiratory: Negative for cough and wheezing.   Cardiovascular: Negative for chest pain.  Gastrointestinal: Negative for abdominal pain, blood in stool and vomiting.  Genitourinary: Negative for difficulty urinating and frequency.  Musculoskeletal: Negative for neck pain.  Skin: Negative for rash.  Allergic/Immunologic: Negative for environmental allergies and food allergies.  Neurological: Negative for weakness and headaches.  Psychiatric/Behavioral: Negative for agitation.  All other systems reviewed and are negative.      Objective:   Physical Exam  Constitutional: He appears well-developed and  well-nourished.  HENT:  Head: Normocephalic and atraumatic.  Right Ear: External ear normal.  Left Ear: External ear normal.  Nose: Nose normal.  Mouth/Throat: Oropharynx is clear and moist.  Eyes: Pupils are equal, round, and reactive to light. EOM are normal.  Neck: Normal range of motion. Neck supple. No thyromegaly present.  Cardiovascular: Normal rate, regular rhythm and normal heart sounds.  No murmur heard. Pulmonary/Chest: Effort normal and breath sounds normal. No respiratory distress. He has no wheezes.  Abdominal: Soft. Bowel sounds are normal. He exhibits no distension and no mass. There is no tenderness.  Genitourinary: Penis normal.  Musculoskeletal: Normal range of motion. He exhibits no edema.  Lymphadenopathy:    He has no cervical adenopathy.  Neurological: He is alert. He exhibits normal muscle tone.  Skin: Skin is warm and dry. No erythema.  Psychiatric: He has a normal mood and affect. His behavior is normal. Judgment normal.  Vitals  reviewed.         Assessment & Plan:  Impression wellness exam.  Diet discussed.  Exercise discussed.  Blood work reviewed.  Referral for colonoscopy.  Also Tdap today.  And prescription for Shingrix given.  2.  Hypertension.  Good control.  Compliance reviewed patient maintain same meds follow-up in 6 months

## 2017-09-22 ENCOUNTER — Encounter: Payer: Self-pay | Admitting: Family Medicine

## 2017-09-24 ENCOUNTER — Encounter: Payer: Self-pay | Admitting: Family Medicine

## 2017-09-25 ENCOUNTER — Encounter: Payer: Self-pay | Admitting: Gastroenterology

## 2017-09-29 ENCOUNTER — Encounter: Payer: 59 | Admitting: Family Medicine

## 2017-12-22 ENCOUNTER — Encounter: Payer: Self-pay | Admitting: Gastroenterology

## 2017-12-22 ENCOUNTER — Ambulatory Visit (INDEPENDENT_AMBULATORY_CARE_PROVIDER_SITE_OTHER): Payer: 59 | Admitting: Gastroenterology

## 2017-12-22 VITALS — BP 147/89 | HR 62 | Temp 97.4°F | Ht 71.0 in | Wt 174.6 lb

## 2017-12-22 DIAGNOSIS — K279 Peptic ulcer, site unspecified, unspecified as acute or chronic, without hemorrhage or perforation: Secondary | ICD-10-CM

## 2017-12-22 DIAGNOSIS — K7460 Unspecified cirrhosis of liver: Secondary | ICD-10-CM

## 2017-12-22 DIAGNOSIS — B9681 Helicobacter pylori [H. pylori] as the cause of diseases classified elsewhere: Secondary | ICD-10-CM

## 2017-12-22 DIAGNOSIS — F1010 Alcohol abuse, uncomplicated: Secondary | ICD-10-CM

## 2017-12-22 DIAGNOSIS — F1020 Alcohol dependence, uncomplicated: Secondary | ICD-10-CM | POA: Insufficient documentation

## 2017-12-22 DIAGNOSIS — Z86010 Personal history of colonic polyps: Secondary | ICD-10-CM

## 2017-12-22 NOTE — Patient Instructions (Addendum)
1. You need some labs to follow up on your liver and to make sure your stomach infection was cured (H.pylori). I have provided you with orders, you can go whenever you are ready. You can wait until January if you prefer, due to insurance issues.  2. You also need an updated ultrasound of your liver. Whenever you are ready to schedule please call. You can wait until January if you prefer, due to insurance issues.  3. Dr. Oneida Alar said you are due for your next colonoscopy in 2024.  4. 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 Ultimate goal of NO alcohol.    What You Need To Know About Alcohol Dependence, Adult Alcohol is a widely available drug. People who use alcohol will consume it in varying amounts. People who drink alcohol in excess, and have behavior problems during and after drinking alcohol, may have what is called an alcohol use disorder. Alcohol abuse and alcohol dependence are the two main types of alcohol use disorders:  Alcohol abuse is when you use alcohol too much or too often. You may use alcohol to make yourself feel happy or to reduce stress, but you may have a hard time setting a limit on the amount you drink.  Alcohol dependence is when you use alcohol excessively for a period of time, and your body and brain chemistry changes as a result. This can make it hard to stop drinking because you may start to feel sick or feel different when you do not use alcohol.  How can alcohol abuse and dependence affect me? Alcohol abuse and dependence can have a negative effect on your life. Excessive use of alcohol may lead to an addiction. You may feel like you need alcohol to function normally. You may drink alcohol before work in the morning, during the day, or as soon as you get home from work in the evening. These actions can result in:  Poor performance at work.  Losing your job.  Financial problems.  Car crashes or criminal  charges from driving after drinking alcohol.  Problems in your relationships with friends and family.  Losing the trust and respect of co-workers, friends, and family.  Drinking heavily over a long period of time can permanently damage your body and brain, and can cause lifelong health issues, such as:  Liver disease.  Heart problems, high blood pressure, or stroke.  Damage to your pancreas.  Certain cancers.  Decreased ability to fight infections.  Numbness or tingling in hands or feet (neuropathy).  Brain damage.  Depression.  Early (premature) death.  When your body craves alcohol, it is easy to drink more than your body can handle. As a result, you may overdose. Alcohol overdose is a serious situation that requires hospitalization. It may lead to permanent injuries or death. What are the benefits of avoiding alcohol use? Limiting or avoiding alcohol can help you:  Avoid risks to your body, brain, and relationships.  Avoid the risk of abusing or becoming dependent on alcohol.  Keep your mind and body healthy. As a result, you may be more likely to accomplish your life goals.  Avoid permanent injury, organ damage, or death due to alcohol use.  What steps can I take to stop drinking?  The best way to avoid alcohol abuse, dependence, and addiction is not to drink at all, or to drink measured amounts. Measured drinking means no more than 1 drink a  day for nonpregnant women and 2 drinks a day for men. One drink equals 12 oz of beer, 5 oz of wine, or 1 oz of hard liquor.  Stop drinking if you have been drinking too much. This can be very hard to do if you are used to abusing alcohol. If you find it hard to stop drinking, talk about your experience with someone you trust. This person may be able to help you change your drinking behavior.  Instead of drinking alcohol, do something else, like a hobby or exercise.  Find healthy ways to cope with stress, such as exercise,  meditation, or spending time with people you care about.  In social gatherings and places where there may be alcohol, make intentional choices to drink non-alcohol beverages.  If your family, co-workers, or friends drink, talk to them about supporting you in your efforts to stop drinking. Ask them not to drink around you. Spend more time with people who do not drink alcohol.  If you think that you have an alcohol dependency problem: ? Tell friends or family about your concerns. ? Talk with your health care provider or another health professional about where to get help. ? Work with a Transport planner and a Regulatory affairs officer. ? Consider joining a support group for people who struggle with alcohol abuse, dependence, and addiction. Where to find support: You can get support for preventing alcohol abuse, dependence, and addiction from:  Your health care provider.  Alcoholics Anonymous (AA): NicTax.com.pt  SMART Recovery: www.smartrecovery.org  Local treatment centers or chemical dependency counselors.  Where to find more information: Learn more about alcohol abuse and dependence from:  Centers for Disease Control and Prevention: GoalForum.com.au  [employer] on Alcohol Abuse and Alcoholism: https://clark.org/  Local AA groups in your community.  Contact a health care provider if:  You drink more or for longer than you intended, on more than one occasion.  You tried to stop drinking or to cut back on how much you drink, but you were not able to.  You often drink to the point of vomiting or passing out.  You want to drink so badly that you cannot think about anything else.  Drinking has created problems in your life, but you continue to drink.  You keep drinking even though you feel anxious, depressed, or have experienced memory loss.  You have stopped doing the things you used to enjoy in order  to drink.  You have to drink more than you used to in order to get the effect you want.  You experience anxiety, sweating, nausea, shakiness, and trouble sleeping when you try to stop drinking.  You have thoughts about hurting yourself or others. If you ever feel like you may hurt yourself or others, or have thoughts about taking your own life, get help right away. You can go to your nearest emergency department or call:  Your local emergency services (911 in the [place]  A suicide crisis helpline, such as the [place] at 8208393672. This is open 24 hours a day.  Summary  Alcohol is a widely available drug. Misusing, abusing, and becoming dependent on alcohol can cause many problems.  It is important to measure and limit the amount of alcohol you consume. It is recommended to limit alcohol use to 1 drink a day for nonpregnant women and 2 drinks a day for men.  The risks associated with drinking too much will have a direct negative impact on your work, relationships, and  health.  If you realize that you are having some challenges keeping your drinking under control, find some ways to change your behavior. Hobbies, self calming activities, exercise, or support groups can help.  If you feel you need help with changing your drinking habits, talk with your health care provider, a good friend, or a therapist, or go to an [employer] group. This information is not intended to replace advice given to you by your health care provider. Make sure you discuss any questions you have with your health care provider. Document Released: 02/16/2016 Document Revised: 02/16/2016 Document Reviewed: 02/16/2016 Elsevier Interactive Patient Education  [employer].

## 2017-12-22 NOTE — Progress Notes (Signed)
Primary Care Physician: Mikey Kirschner, MD  Primary Gastroenterologist:  Barney Drain, MD   Chief Complaint  Patient presents with  . Cirrhosis    f/u.   Marland Kitchen Consult    TCS    HPI: Gary Harrison is a 62 y.o. male here for follow-up.  We received a referral for 5-year follow-up colonoscopy for history of polyps from his PCP.  Dr. Oneida Alar previous recommendations were for a 10-year follow-up colonoscopy because he only had one simple adenoma at time of his colonoscopy in 2014.  Patient was supposed to follow-up in early 2015 for new diagnosis of possible cirrhosis (U/S 2014) and for repeat EGD to assess healing of his ulcers at that time.  He was lost to follow-up.  Patient states after we saw him 2014 he was scared about the diagnosis of cirrhosis.  He quit drinking for about 3 to 4 months but then started back.  He consumes a mixed beverage as well as 3-4 beers every day.  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  He denies abdominal pain, constipation, diarrhea, melena, rectal bleeding.  Denies heartburn, dysphagia, vomiting.  He is interested in pursuing work-up as recommended but would like to postpone until January for insurance reasons.   Current Outpatient Medications  Medication Sig Dispense Refill  . amLODipine (NORVASC) 5 MG tablet Take 1 tablet (5 mg total) by mouth daily. 30 tablet 5  . fluticasone (FLONASE) 50 MCG/ACT nasal spray instill 2 sprays into each nostril once daily (Patient taking differently: as needed. instill 2 sprays into each nostril once daily) 16 g 5  . tadalafil (CIALIS) 10 MG tablet Take 1 tablet (10 mg total) by mouth daily. as directed 8 tablet 5  . tamsulosin (FLOMAX) 0.4 MG CAPS capsule Take 1 capsule (0.4 mg total) by mouth at bedtime. (Patient taking differently: Take 0.4 mg by mouth as needed. ) 15 capsule 0    . triamcinolone cream (KENALOG) 0.1 % apply to affected area twice a day 30 g 11   No current facility-administered medications for this visit.     Allergies as of 12/22/2017 - Review Complete 12/22/2017  Allergen Reaction Noted  . Iodine  01/11/2013   Past Medical History:  Diagnosis Date  . ED (erectile dysfunction)   . H pylori ulcer    2014, multiple gastric and duodenal ulcers due to H. pylori.  Treated with amoxicillin, Biaxin, PPI  . Hypertension    no meds currently  . Impaired fasting glucose    Past Surgical History:  Procedure Laterality Date  . COLONOSCOPY  10/18/2007     QQI:WLNLG descending colon polyps.  The polyps ranged from 8 mm  to 1.2 cm.  One pedunculated sigmoid colon polyp removed via snare cautery; others via cold forceps. One 3-mm cecal polyp removed via cold forceps.  Otherwise, no masses, inflammatory changes, diverticular, or arteriovenous malformations seen. simple adenomas. Needs surveillance 2014  . COLONOSCOPY WITH ESOPHAGOGASTRODUODENOSCOPY (EGD) N/A 01/25/2013   Dr. Oneida Alar: Multiple small ulcers seen in the gastric antrum as well as the duodenum.  Biopsies showed H. pylori gastritis.  Need to come back for 25-month follow-up EGD but he did not have this done.  Colonoscopy showed small internal hemorrhoids, 8 colon polyps removed only one was a simple adenoma.  Next colonoscopy in 10 years.  Family History  Problem Relation Age of Onset  . Colon cancer Neg Hx    Social History   Tobacco Use  . Smoking status: Current Every Day Smoker    Packs/day: 0.50    Types: Cigarettes  . Smokeless tobacco: Never Used  Substance Use Topics  . Alcohol use: Yes    Comment: Consumes a mix drinking at least 3 beers every day.,  Remote DUI in the 6s.  . Drug use: No    ROS:  General: Negative for anorexia, weight loss, fever, chills, fatigue, weakness. ENT: Negative for hoarseness, difficulty swallowing , nasal congestion. CV: Negative for chest pain,  angina, palpitations, dyspnea on exertion, peripheral edema.  Respiratory: Negative for dyspnea at rest, dyspnea on exertion, cough, sputum, wheezing.  GI: See history of present illness. GU:  Negative for dysuria, hematuria, urinary incontinence, urinary frequency, nocturnal urination.  Endo: Negative for unusual weight change.    Physical Examination:   BP (!) 147/89   Pulse 62   Temp (!) 97.4 F (36.3 C)   Ht 5\' 11"  (1.803 m)   Wt 174 lb 9.6 oz (79.2 kg)   BMI 24.35 kg/m   General: Well-nourished, well-developed in no acute distress.  Eyes: No icterus. Mouth: Oropharyngeal mucosa moist and pink , no lesions erythema or exudate. Lungs: Clear to auscultation bilaterally.  Heart: Regular rate and rhythm, no murmurs rubs or gallops.  Abdomen: Bowel sounds are normal, nontender, nondistended, no hepatosplenomegaly or masses, no abdominal bruits or hernia , no rebound or guarding.   Extremities: No lower extremity edema. No clubbing or deformities. Neuro: Alert and oriented x 4   Skin: Warm and dry, no jaundice.   Psych: Alert and cooperative, normal mood and affect.  Labs:  Lab Results  Component Value Date   CREATININE 0.91 09/18/2017   BUN 9 09/18/2017   NA 143 09/18/2017   K 4.2 09/18/2017   CL 103 09/18/2017   CO2 20 09/18/2017   Lab Results  Component Value Date   ALT 38 09/18/2017   AST 27 09/18/2017   ALKPHOS 98 09/18/2017   BILITOT 0.5 09/18/2017   No results found for: WBC, HGB, HCT, MCV, PLT No results found for: INR, PROTIME  Imaging Studies: No results found.

## 2017-12-29 ENCOUNTER — Encounter: Payer: Self-pay | Admitting: Gastroenterology

## 2017-12-29 DIAGNOSIS — K279 Peptic ulcer, site unspecified, unspecified as acute or chronic, without hemorrhage or perforation: Secondary | ICD-10-CM

## 2017-12-29 DIAGNOSIS — B9681 Helicobacter pylori [H. pylori] as the cause of diseases classified elsewhere: Secondary | ICD-10-CM | POA: Insufficient documentation

## 2017-12-29 NOTE — Progress Notes (Signed)
CC'D TO PCP °

## 2017-12-29 NOTE — Assessment & Plan Note (Signed)
Last colonoscopy with one simple adenoma, Dr. Oneida Alar advises 10-year follow-up colonoscopy which would be in 2024.  Patient aware.

## 2017-12-29 NOTE — Assessment & Plan Note (Signed)
Encouraged reduction of alcohol use with goal of alcohol cessation.  Handout provided with resources.  Return to the office in January 2020.

## 2017-12-29 NOTE — Assessment & Plan Note (Addendum)
Lost to follow-up.  Encourage patient to pursue follow-up ultrasound, labs.  He wants to wait till January due to insurance purposes.  Orders for labs provided.  He will call when he is ready to schedule his ultrasound.  He also needs EGD to screen for esophageal varices.  Plan to bring the patient back in January 2020, make arrangements for EGD at that time.  Patient states he is currently on his wife's insurance and does not have good coverage and would like to wait till after the first of the year.

## 2017-12-29 NOTE — Assessment & Plan Note (Addendum)
History of severe H. pylori ulcer disease in 2014.  Treated with amoxicillin, Biaxin, PPI.  Recheck H check H. pylori stool antigen in the near future.  Patient never had follow-up EGD as recommended.  EGD planned for first a year for screening for esophageal varices.

## 2018-02-03 ENCOUNTER — Emergency Department (HOSPITAL_COMMUNITY): Payer: 59

## 2018-02-03 ENCOUNTER — Encounter (HOSPITAL_COMMUNITY): Payer: Self-pay | Admitting: Emergency Medicine

## 2018-02-03 ENCOUNTER — Other Ambulatory Visit: Payer: Self-pay

## 2018-02-03 ENCOUNTER — Emergency Department (HOSPITAL_COMMUNITY)
Admission: EM | Admit: 2018-02-03 | Discharge: 2018-02-03 | Disposition: A | Discharge status: Home / Self Care | Payer: 59 | Attending: Emergency Medicine | Admitting: Emergency Medicine

## 2018-02-03 DIAGNOSIS — Y92018 Other place in single-family (private) house as the place of occurrence of the external cause: Secondary | ICD-10-CM | POA: Insufficient documentation

## 2018-02-03 DIAGNOSIS — S3991XA Unspecified injury of abdomen, initial encounter: Secondary | ICD-10-CM | POA: Diagnosis not present

## 2018-02-03 DIAGNOSIS — S52292A Other fracture of shaft of left ulna, initial encounter for closed fracture: Secondary | ICD-10-CM | POA: Diagnosis not present

## 2018-02-03 DIAGNOSIS — S3993XA Unspecified injury of pelvis, initial encounter: Secondary | ICD-10-CM | POA: Diagnosis not present

## 2018-02-03 DIAGNOSIS — S299XXA Unspecified injury of thorax, initial encounter: Secondary | ICD-10-CM | POA: Diagnosis not present

## 2018-02-03 DIAGNOSIS — W19XXXA Unspecified fall, initial encounter: Secondary | ICD-10-CM

## 2018-02-03 DIAGNOSIS — Y93H9 Activity, other involving exterior property and land maintenance, building and construction: Secondary | ICD-10-CM | POA: Insufficient documentation

## 2018-02-03 DIAGNOSIS — Y999 Unspecified external cause status: Secondary | ICD-10-CM | POA: Insufficient documentation

## 2018-02-03 DIAGNOSIS — S29001A Unspecified injury of muscle and tendon of front wall of thorax, initial encounter: Secondary | ICD-10-CM | POA: Diagnosis not present

## 2018-02-03 DIAGNOSIS — M79602 Pain in left arm: Secondary | ICD-10-CM | POA: Diagnosis not present

## 2018-02-03 DIAGNOSIS — F17210 Nicotine dependence, cigarettes, uncomplicated: Secondary | ICD-10-CM | POA: Diagnosis not present

## 2018-02-03 DIAGNOSIS — W11XXXA Fall on and from ladder, initial encounter: Secondary | ICD-10-CM | POA: Insufficient documentation

## 2018-02-03 DIAGNOSIS — I10 Essential (primary) hypertension: Secondary | ICD-10-CM | POA: Diagnosis not present

## 2018-02-03 DIAGNOSIS — S52252A Displaced comminuted fracture of shaft of ulna, left arm, initial encounter for closed fracture: Secondary | ICD-10-CM | POA: Insufficient documentation

## 2018-02-03 DIAGNOSIS — M549 Dorsalgia, unspecified: Secondary | ICD-10-CM | POA: Insufficient documentation

## 2018-02-03 DIAGNOSIS — Z79899 Other long term (current) drug therapy: Secondary | ICD-10-CM | POA: Insufficient documentation

## 2018-02-03 LAB — I-STAT CHEM 8, ED
BUN: 6 mg/dL (ref 6–20)
CALCIUM ION: 1.18 mmol/L (ref 1.15–1.40)
Chloride: 101 mmol/L (ref 98–111)
Creatinine, Ser: 0.8 mg/dL (ref 0.61–1.24)
GLUCOSE: 136 mg/dL — AB (ref 70–99)
HCT: 42 % (ref 39.0–52.0)
Hemoglobin: 14.3 g/dL (ref 13.0–17.0)
Potassium: 3.4 mmol/L — ABNORMAL LOW (ref 3.5–5.1)
Sodium: 139 mmol/L (ref 135–145)
TCO2: 29 mmol/L (ref 22–32)

## 2018-02-03 MED ORDER — HYDROMORPHONE HCL 1 MG/ML IJ SOLN
1.0000 mg | Freq: Once | INTRAMUSCULAR | Status: AC
  Administered 2018-02-03: 1 mg via INTRAVENOUS

## 2018-02-03 MED ORDER — SODIUM CHLORIDE 0.9 % IV BOLUS
250.0000 mL | Freq: Once | INTRAVENOUS | Status: AC
  Administered 2018-02-03: 250 mL via INTRAVENOUS

## 2018-02-03 MED ORDER — IOPAMIDOL (ISOVUE-300) INJECTION 61%
100.0000 mL | Freq: Once | INTRAVENOUS | Status: AC | PRN
  Administered 2018-02-03: 100 mL via INTRAVENOUS

## 2018-02-03 MED ORDER — HYDROMORPHONE HCL 1 MG/ML IJ SOLN
INTRAMUSCULAR | Status: AC
  Administered 2018-02-03: 1 mg via INTRAVENOUS
  Filled 2018-02-03: qty 1

## 2018-02-03 MED ORDER — TRAMADOL HCL 50 MG PO TABS: 50.0000 mg | ORAL_TABLET | Freq: Four times a day (QID) | ORAL | 0 refills | Status: DC | PRN

## 2018-02-03 NOTE — ED Triage Notes (Signed)
Patient c/o right back/rib pain and left shoulder/arm pain. Per patient fell off ladder yesterday while cleaning gutters, approx 10 feet. Denies hitting head or LOC. Per patient unable to lift left arm, but is able to move hand and has feeling. Radial pulse present.  Denies any complications with BMs or urination. Patient ambulatory. Reports taking advil and oxycodone without relief. Last took medication at 8:30pm last night.

## 2018-02-03 NOTE — ED Provider Notes (Signed)
[facility] EMERGENCY DEPARTMENT Provider Note   CSN: [id number] Arrival date & time: 02/03/18  1004     History   Chief Complaint Chief Complaint  Patient presents with  . Fall    HPI Gary Harrison is a 63 y.o. male.  Patient states she fell with her yesterday about 10 feet he complains of back pain and left arm pain he has no pain in his head or neck and did not hit his head  The history is provided by the patient. No language interpreter was used.  Fall  This is a new problem. The current episode started 12 to 24 hours ago. The problem occurs rarely. The problem has not changed since onset.Pertinent negatives include no chest pain, no abdominal pain and no headaches. Nothing aggravates the symptoms. Nothing relieves the symptoms. He has tried nothing for the symptoms. The treatment provided no relief.    Past Medical History:  Diagnosis Date  . ED (erectile dysfunction)   . H pylori ulcer    2014, multiple gastric and duodenal ulcers due to H. pylori.  Treated with amoxicillin, Biaxin, PPI  . Hypertension    no meds currently  . Impaired fasting glucose     Patient Active Problem List   Diagnosis Date Noted  . H pylori ulcer 12/29/2017    Class: History of  . Alcohol dependence ([place]) 12/22/2017  . Alcohol abuse, daily use 12/22/2017  . Hepatic cirrhosis ([place]) 12/22/2017  . 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 Allergic rhinitis 03/14/2013  . Essential hypertension, benign 01/13/2013  . Other and unspecified hyperlipidemia 01/13/2013  . Personal history of colonic polyps 12/27/2012  . RUQ fullness 12/27/2012    Past Surgical History:  Procedure Laterality Date  . COLONOSCOPY  10/18/2007     [facility] descending colon polyps.  The polyps ranged from 8 mm  to 1.2 cm.  One pedunculated sigmoid colon polyp removed via snare cautery; others via cold forceps. One 3-mm cecal polyp removed via cold forceps.  Otherwise, no masses, inflammatory  changes, diverticular, or arteriovenous malformations seen. simple adenomas. Needs surveillance 2014  . COLONOSCOPY WITH ESOPHAGOGASTRODUODENOSCOPY (EGD) N/A 01/25/2013   Dr. Oneida Alar: Multiple small ulcers seen in the gastric antrum as well as the duodenum.  Biopsies showed H. pylori gastritis.  Need to come back for 73-month follow-up EGD but he did not have this done.  Colonoscopy showed small internal hemorrhoids, 8 colon polyps removed only one was a simple adenoma.  Next colonoscopy in 10 years.        Home Medications    Prior to Admission medications   Medication Sig Start Date End Date Taking? Authorizing Provider  amLODipine (NORVASC) 5 MG tablet Take 1 tablet (5 mg total) by mouth daily. 05/30/17  Yes Mikey Kirschner, MD  fluticasone (FLONASE) 50 MCG/ACT nasal spray instill 2 sprays into each nostril once daily Patient taking differently: as needed. instill 2 sprays into each nostril once daily 05/30/17  Yes Mikey Kirschner, MD  tadalafil (CIALIS) 10 MG tablet Take 1 tablet (10 mg total) by mouth daily. as directed 05/30/17  Yes Mikey Kirschner, MD  tamsulosin (FLOMAX) 0.4 MG CAPS capsule Take 1 capsule (0.4 mg total) by mouth at bedtime. Patient taking differently: Take 0.4 mg by mouth as needed.  07/21/17  Yes Mikey Kirschner, MD  triamcinolone cream (KENALOG) 0.1 % apply to affected area twice a day 02/16/17  Yes Mikey Kirschner, MD  traMADol (ULTRAM) 50 MG  tablet Take 1 tablet (50 mg total) by mouth every 6 (six) hours as needed. 02/03/18   M[place] Ferguson, MD    Family History Family History  Problem Relation Age of Onset  . Colon cancer Neg Hx     Social History Social History   Tobacco Use  . Smoking status: Current Every Day Smoker    Packs/day: 0.50    Types: Cigarettes  . Smokeless tobacco: Never Used  Substance Use Topics  . Alcohol use: Yes    Comment: Consumes a mix drinking at least 3 beers every day.,  Remote DUI in the 60s.  . Drug use: No      Allergies   Iodine   Review of Systems Review of Systems  Constitutional: Negative for appetite change and fatigue.  HENT: Negative for congestion, ear discharge and sinus pressure.   Eyes: Negative for discharge.  Respiratory: Negative for cough.   Cardiovascular: Negative for chest pain.  Gastrointestinal: Negative for abdominal pain and diarrhea.  Genitourinary: Negative for frequency and hematuria.  Musculoskeletal: Negative for back pain.       Left arm pain and back pain  Skin: Negative for rash.  Neurological: Negative for seizures and headaches.  Psychiatric/Behavioral: Negative for hallucinations.     Physical Exam Updated Vital Signs BP (!) 164/89   Pulse (!) 58   Temp 97.8 F (36.6 C) (Oral)   Resp 18   Ht 5\' 11"  (1.803 m)   Wt 82.6 kg   SpO2 94%   BMI 25.38 kg/m   Physical Exam  Constitutional: He is oriented to person, place, and time. He appears well-developed.  HENT:  Head: Normocephalic.  Eyes: Conjunctivae and EOM are normal. No scleral icterus.  Neck: Neck supple. No thyromegaly present.  Cardiovascular: Normal rate and regular rhythm. Exam reveals no gallop and no friction rub.  No murmur heard. Pulmonary/Chest: No stridor. He has no wheezes. He has no rales. He exhibits no tenderness.  Abdominal: He exhibits no distension. There is no tenderness. There is no rebound.  Musculoskeletal: Normal range of motion. He exhibits no edema.  Tenderness over left scapula also tenderness to the midshaft of the left arm tenderness to lumbar spine  Lymphadenopathy:    He has no cervical adenopathy.  Neurological: He is oriented to person, place, and time. He exhibits normal muscle tone. Coordination normal.  Skin: No rash noted. No erythema.  Psychiatric: He has a normal mood and affect. His behavior is normal.     ED Treatments / Results  Labs (all labs ordered are listed, but only abnormal results are displayed) Labs Reviewed  I-STAT CHEM 8, ED  - Abnormal; Notable for the following components:      Result Value   Potassium 3.4 (*)    Glucose, Bld 136 (*)    All other components within normal limits    EKG None  Radiology Dg Forearm Left  Result Date: 02/03/2018 CLINICAL DATA:  Fall yesterday/pain EXAM: LEFT FOREARM - 2 VIEW COMPARISON:  None. FINDINGS: There is a minimally comminuted transverse fracture through the midshaft of the ulna. No significant distraction of fracture fragments. No angulation or displacement. Radius appears intact. IMPRESSION: Minimally displaced midshaft ulna fracture. Electronically Signed   By: Lucrezia Europe M.D.   On: 02/03/2018 11:49   Ct Chest W Contrast  Result Date: 02/03/2018 CLINICAL DATA:  Fall off ladder yesterday EXAM: CT CHEST, ABDOMEN, AND PELVIS WITH CONTRAST TECHNIQUE: Multidetector CT imaging of the chest, abdomen and pelvis was  performed following the standard protocol during bolus administration of intravenous contrast. CONTRAST:  144mL ISOVUE-300 IOPAMIDOL (ISOVUE-300) INJECTION 61% COMPARISON:  None. FINDINGS: CT CHEST FINDINGS Cardiovascular: Aortic atherosclerotic calcifications are noted. There is no obvious evidence of aortic injury, dissection, or intramural hematoma moderate 3 vessel coronary artery calcification. Great vessels are grossly patent within the confines of the examination. Mediastinum/Nodes: No abnormal mediastinal adenopathy. No evidence of mediastinal hemorrhage. Esophagus is unremarkable. No pericardial effusion. Lungs/Pleura: Dependent atelectasis. No pneumothorax or pleural effusion. Musculoskeletal: No vertebral compression deformity. No definite acute rib fracture. CT ABDOMEN PELVIS FINDINGS Hepatobiliary: There are multiple scattered subcentimeter low-density lesions in the liver. Gallbladder is decompressed. Pancreas: Unremarkable Spleen: Unremarkable Adrenals/Urinary Tract: Adrenal glands are within normal limits. Kidneys are within normal limits. Bladder is within  normal limits. Stomach/Bowel: Normal appendix. No obvious mass in the colon. No evidence of small-bowel obstruction. Stomach is within normal limits. Vascular/Lymphatic: Aortic atherosclerotic calcifications are noted. There are atherosclerotic calcifications in the iliac vasculature and visceral vasculature. There is focal mixed plaque in the SMA 2.5 cm beyond the origin without significant associated luminal narrowing. 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 No abnormal retroperitoneal adenopathy. Reproductive: Prostate is upper normal in size. Other: No free-fluid. No free intraperitoneal hemorrhage. No evidence of retroperitoneal hemorrhage. Musculoskeletal: No vertebral compression deformity. 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 L5 is sacralized and transitional. Prominent left posterior paracentral L4-5 disc osteophytes are noted. Bilateral foraminal narrowing is present at L2-3, L3-4, and L4-5. IMPRESSION: Thorax: No evidence of intrathoracic injury. Abdomen and pelvis: No evidence of acute injury within the abdominal or pelvic organs. There are small lesions throughout the liver as described. Metastatic disease is not excluded. If there is a history of malignancy or risk of malignancy, six-month follow-up MRI is recommended. Chronic changes. Electronically Signed   By: Marybelle Killings M.D.   On: 02/03/2018 12:48   Ct Abdomen Pelvis W Contrast  Result Date: 02/03/2018 CLINICAL DATA:  Fall off ladder yesterday EXAM: CT CHEST, ABDOMEN, AND PELVIS WITH CONTRAST TECHNIQUE: Multidetector CT imaging of the chest, abdomen and pelvis was performed following the standard protocol during bolus administration of intravenous contrast. CONTRAST:  119mL ISOVUE-300 IOPAMIDOL (ISOVUE-300) INJECTION 61% COMPARISON:  None. FINDINGS: CT CHEST FINDINGS Cardiovascular: Aortic atherosclerotic calcifications are noted.  There is no obvious evidence of aortic injury, dissection, or intramural hematoma moderate 3 vessel coronary artery calcification. Great vessels are grossly patent within the confines of the examination. Mediastinum/Nodes: No abnormal mediastinal adenopathy. No evidence of mediastinal hemorrhage. Esophagus is unremarkable. No pericardial effusion. Lungs/Pleura: Dependent atelectasis. No pneumothorax or pleural effusion. Musculoskeletal: No vertebral compression deformity. No definite acute rib fracture. CT ABDOMEN PELVIS FINDINGS Hepatobiliary: There are multiple scattered subcentimeter low-density lesions in the liver. Gallbladder is decompressed. Pancreas: Unremarkable Spleen: Unremarkable Adrenals/Urinary Tract: Adrenal glands are within normal limits. Kidneys are within normal limits. Bladder is within normal limits. Stomach/Bowel: Normal appendix. No obvious mass in the colon. No evidence of small-bowel obstruction. Stomach is within normal limits. Vascular/Lymphatic: Aortic atherosclerotic calcifications are noted. There are atherosclerotic calcifications in the iliac vasculature and visceral vasculature. There is focal mixed plaque in the SMA 2.5 cm beyond the origin without significant associated luminal narrowing. 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 No abnormal retroperitoneal adenopathy. Reproductive: Prostate is upper normal in size. Other: No free-fluid. No free intraperitoneal hemorrhage. No evidence of  retroperitoneal hemorrhage. Musculoskeletal: No vertebral compression deformity. 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 L5 is sacralized and transitional. Prominent left posterior paracentral L4-5 disc osteophytes are noted. Bilateral foraminal narrowing is present at L2-3, L3-4, and L4-5. IMPRESSION: Thorax: No evidence of intrathoracic injury. Abdomen and pelvis: No evidence of acute injury  within the abdominal or pelvic organs. There are small lesions throughout the liver as described. Metastatic disease is not excluded. If there is a history of malignancy or risk of malignancy, six-month follow-up MRI is recommended. Chronic changes. Electronically Signed   By: Marybelle Killings M.D.   On: 02/03/2018 12:48    Procedures Procedures (including critical care time)  Medications Ordered in ED Medications  sodium chloride 0.9 % bolus 250 mL (0 mLs Intravenous Stopped 02/03/18 1219)  iopamidol (ISOVUE-300) 61 % injection 100 mL (100 mLs Intravenous Contrast Given 02/03/18 1211)  HYDROmorphone (DILAUDID) injection 1 mg (1 mg Intravenous Given 02/03/18 1250)     Initial Impression / Assessment and Plan / ED Course  I have reviewed the triage vital signs and the nursing notes.  Pertinent labs & imaging results that were available during my care of the patient were reviewed by me and considered in my medical decision making (see chart for details).     Patient with a fractured left forearm.  He will follow-up with orthopedics.  Patient also has small lesions in his liver that his family doctor will follow-up on.  Patient was instructed about the liver lesions  Final Clinical Impressions(s) / ED Diagnoses   Final diagnoses:  Fall, initial encounter    ED Discharge Orders         Ordered    traMADol (ULTRAM) 50 MG tablet  Every 6 hours PRN     02/03/18 1422           M[place] Ferguson, MD 02/03/18 1428

## 2018-02-03 NOTE — Discharge Instructions (Addendum)
Follow-up with your family doctor for any aches and pains.  Follow-up with an orthopedic doctor for your left arm.  You can see Dr. Aline Brochure or Dr. Lucia Gaskins

## 2018-02-05 ENCOUNTER — Telehealth: Payer: Self-pay | Admitting: Orthopedic Surgery

## 2018-02-05 NOTE — Telephone Encounter (Signed)
Call received from patient and spouse this morning approximately 8:50am, relays patient was seen at [facility] on 02/03/18; had Xrays, has fracture of forearm; discussed appointment, and relayed will call back to patient to further discuss, as I did offer appointment for tomorrow with Dr Luna Glasgow. After reviewing with clinical staff, Dr Luna Glasgow may not see for this issue; therefore, it would be out another day for Dr Aline Brochure to see, which may not want to do. Per call back with patient and wife, states they are trying to contact the ortho who was on call at time of emergency room visit, and will let us know if an appointment is needed with Dr Aline Brochure.

## 2018-02-07 ENCOUNTER — Ambulatory Visit: Payer: 59 | Admitting: Orthopedic Surgery

## 2018-02-09 DIAGNOSIS — S52202A Unspecified fracture of shaft of left ulna, initial encounter for closed fracture: Secondary | ICD-10-CM | POA: Diagnosis not present

## 2018-02-20 ENCOUNTER — Other Ambulatory Visit: Payer: Self-pay | Admitting: Family Medicine

## 2018-02-20 NOTE — Telephone Encounter (Signed)
Pt checking on status of medication refill.

## 2018-03-09 DIAGNOSIS — S52202D Unspecified fracture of shaft of left ulna, subsequent encounter for closed fracture with routine healing: Secondary | ICD-10-CM | POA: Diagnosis not present

## 2018-04-03 DIAGNOSIS — Z86010 Personal history of colonic polyps: Secondary | ICD-10-CM | POA: Diagnosis not present

## 2018-04-03 DIAGNOSIS — K279 Peptic ulcer, site unspecified, unspecified as acute or chronic, without hemorrhage or perforation: Secondary | ICD-10-CM | POA: Diagnosis not present

## 2018-04-03 DIAGNOSIS — K7460 Unspecified cirrhosis of liver: Secondary | ICD-10-CM | POA: Diagnosis not present

## 2018-04-04 LAB — COMPREHENSIVE METABOLIC PANEL
A/G RATIO: 1.5 (ref 1.2–2.2)
ALBUMIN: 4.6 g/dL (ref 3.8–4.9)
ALK PHOS: 106 IU/L (ref 39–117)
ALT: 29 IU/L (ref 0–44)
AST: 16 IU/L (ref 0–40)
BUN / CREAT RATIO: 14 (ref 10–24)
BUN: 13 mg/dL (ref 8–27)
Bilirubin Total: 0.3 mg/dL (ref 0.0–1.2)
CHLORIDE: 97 mmol/L (ref 96–106)
CO2: 26 mmol/L (ref 20–29)
Calcium: 9.8 mg/dL (ref 8.6–10.2)
Creatinine, Ser: 0.95 mg/dL (ref 0.76–1.27)
GFR calc Af Amer: 100 mL/min/{1.73_m2} (ref >59)
GFR calc non Af Amer: 87 mL/min/{1.73_m2} (ref >59)
Globulin, Total: 3 g/dL (ref 1.5–4.5)
Glucose: 89 mg/dL (ref 65–99)
POTASSIUM: 4.5 mmol/L (ref 3.5–5.2)
SODIUM: 138 mmol/L (ref 134–144)
Total Protein: 7.6 g/dL (ref 6.0–8.5)

## 2018-04-04 LAB — CBC WITH DIFFERENTIAL/PLATELET
BASOS ABS: 0.1 10*3/uL (ref 0.0–0.2)
Basos: 1 %
EOS (ABSOLUTE): 0.2 10*3/uL (ref 0.0–0.4)
Eos: 3 %
Hematocrit: 37.4 % — ABNORMAL LOW (ref 37.5–51.0)
Hemoglobin: 13.7 g/dL (ref 13.0–17.7)
Immature Grans (Abs): 0 10*3/uL (ref 0.0–0.1)
Immature Granulocytes: 0 %
LYMPHS ABS: 2.6 10*3/uL (ref 0.7–3.1)
Lymphs: 33 %
MCH: 36.5 pg — AB (ref 26.6–33.0)
MCHC: 36.6 g/dL — AB (ref 31.5–35.7)
MCV: 100 fL — AB (ref 79–97)
MONOS ABS: 0.8 10*3/uL (ref 0.1–0.9)
Monocytes: 10 %
Neutrophils Absolute: 4.1 10*3/uL (ref 1.4–7.0)
Neutrophils: 53 %
PLATELETS: 249 10*3/uL (ref 150–450)
RBC: 3.75 x10E6/uL — AB (ref 4.14–5.80)
RDW: 11.9 % (ref 11.6–15.4)
WBC: 7.8 10*3/uL (ref 3.4–10.8)

## 2018-04-04 LAB — PROTIME-INR
INR: 1 (ref 0.8–1.2)
Prothrombin Time: 10.4 s (ref 9.1–12.0)

## 2018-04-04 LAB — AFP TUMOR MARKER: AFP, Serum, Tumor Marker: 3.7 ng/mL (ref 0.0–8.3)

## 2018-04-16 NOTE — Progress Notes (Signed)
LMOM to call.

## 2018-04-16 NOTE — Progress Notes (Signed)
LMOM for a return call.  

## 2018-04-17 ENCOUNTER — Other Ambulatory Visit: Payer: Self-pay | Admitting: *Deleted

## 2018-04-17 ENCOUNTER — Telehealth: Payer: Self-pay | Admitting: *Deleted

## 2018-04-17 DIAGNOSIS — K769 Liver disease, unspecified: Secondary | ICD-10-CM

## 2018-04-17 NOTE — Telephone Encounter (Signed)
[place] website for [place] MRI. Received message: "This member's plan does not currently require notification or prior-authorization through the [place] Notification or Prior-Authorization Program.  Please contact a Customer Care Professional at [phone] if you believe the information returned to be in error."

## 2018-04-17 NOTE — Progress Notes (Signed)
PT left VM that he works days. He takes a break at 10:00 am, 12:00 noon and gets off at 4:30 pm.

## 2018-04-17 NOTE — Progress Notes (Signed)
Pt is aware of results and plan. Ok to schedule an OV. Ok to schedule the MRI. Forwarding to [facility] to schedule the MRI. Forwarding to [facility] to schedule the OV. FYI to Neil Crouch, PA.

## 2018-04-18 ENCOUNTER — Encounter: Payer: Self-pay | Admitting: Gastroenterology

## 2018-04-18 NOTE — Progress Notes (Signed)
Erline Levine, please schedule OV.

## 2018-04-18 NOTE — Progress Notes (Signed)
PATIENT SCHEDULED AND LETTER SENT  °

## 2018-05-07 ENCOUNTER — Other Ambulatory Visit: Payer: Self-pay | Admitting: Family Medicine

## 2018-05-12 ENCOUNTER — Ambulatory Visit
Admission: RE | Admit: 2018-05-12 | Discharge: 2018-05-12 | Disposition: A | Discharge status: Home / Self Care | Payer: 59 | Source: Ambulatory Visit | Attending: Gastroenterology | Admitting: Gastroenterology

## 2018-05-12 DIAGNOSIS — K7689 Other specified diseases of liver: Secondary | ICD-10-CM | POA: Diagnosis not present

## 2018-05-12 DIAGNOSIS — K769 Liver disease, unspecified: Secondary | ICD-10-CM

## 2018-05-12 MED ORDER — GADOBENATE DIMEGLUMINE 529 MG/ML IV SOLN
17.0000 mL | Freq: Once | INTRAVENOUS | Status: AC | PRN
  Administered 2018-05-12: 17 mL via INTRAVENOUS

## 2018-05-14 NOTE — Progress Notes (Signed)
LMOM to call.

## 2018-06-02 ENCOUNTER — Other Ambulatory Visit: Payer: Self-pay | Admitting: Family Medicine

## 2018-06-09 ENCOUNTER — Other Ambulatory Visit: Payer: Self-pay | Admitting: Family Medicine

## 2018-06-11 NOTE — Telephone Encounter (Signed)
Virtual visit 

## 2018-06-12 NOTE — Telephone Encounter (Signed)
Left message to return call 

## 2018-06-13 NOTE — Telephone Encounter (Signed)
Pt has appt for Friday 06/15/2018

## 2018-06-15 ENCOUNTER — Ambulatory Visit (INDEPENDENT_AMBULATORY_CARE_PROVIDER_SITE_OTHER): Payer: 59 | Admitting: Family Medicine

## 2018-06-15 ENCOUNTER — Encounter: Payer: Self-pay | Admitting: Family Medicine

## 2018-06-15 ENCOUNTER — Other Ambulatory Visit: Payer: Self-pay

## 2018-06-15 DIAGNOSIS — I10 Essential (primary) hypertension: Secondary | ICD-10-CM

## 2018-06-15 NOTE — Progress Notes (Signed)
   Subjective:    Patient ID: Gary Harrison, male    DOB: 06/02/1955, 63 y.o.   MRN: [id number] Done via telephone no visual status Hypertension  This is a chronic problem. Treatments tried: amlodipine  There are no compliance problems (takes med every day, eats healthy, exercises).    Pt states no concerns today.   Virtual Visit via Telephone Note  I connected with Gary Harrison on 06/15/18 at 11:00 AM EDT by telephone and verified that I am speaking with the correct person using two identifiers.   I discussed the limitations, risks, security and privacy concerns of performing an evaluation and management service by telephone and the availability of in person appointments. I also discussed with the patient that there may be a patient responsible charge related to this service. The patient expressed understanding and agreed to proceed.   History of Present Illness:    Observations/Objective:   Assessment and Plan:   Follow Up Instructions:    I discussed the assessment and treatment plan with the patient. The patient was provided an opportunity to ask questions and all were answered. The patient agreed with the plan and demonstrated an understanding of the instructions.   The patient was advised to call back or seek an in-person evaluation if the symptoms worsen or if the condition fails to improve as anticipated.  I provided 20 minutes of non-face-to-face time during this encounter.   Blood pressure medicine and blood pressure levels reviewed today with patient. Compliant with blood pressure medicine. States does not miss a dose. No obvious side effects. Blood pressure generally good when checked elsewhere. Watching salt intake.    Review of Systems No headache, no major weight loss or weight gain, no chest pain no back pain abdominal pain no change in bowel habits complete ROS otherwise negative     Objective:   Physical Exam   No exam virtual visit      Assessment & Plan:  Impression hypertension.  Good control.  Discussed.  Maintain same meds.  Rationale discussed.  Diet exercise discussed follow-up in 6 months for wellness plus chronic

## 2018-06-26 ENCOUNTER — Ambulatory Visit: Payer: 59 | Admitting: Gastroenterology

## 2018-09-13 ENCOUNTER — Ambulatory Visit: Payer: 59 | Admitting: Gastroenterology

## 2018-09-13 ENCOUNTER — Encounter: Payer: Self-pay | Admitting: Gastroenterology

## 2018-09-13 ENCOUNTER — Other Ambulatory Visit: Payer: Self-pay

## 2018-09-13 DIAGNOSIS — K709 Alcoholic liver disease, unspecified: Secondary | ICD-10-CM

## 2018-09-13 NOTE — Progress Notes (Signed)
Primary Care Physician: Mikey Kirschner, MD  Primary Gastroenterologist:  Barney Drain, MD   Chief Complaint  Patient presents with  . Cirrhosis    HPI: Gary Harrison is a 63 y.o. male here for follow-up.  He has a history of hepatic cirrhosis in the setting of alcohol abuse based on ultrasound findings in 2014.  EGD 2014 with multiple small ulcers in the gastric antrum, biopsies showed severe H. pylori gastritis.  He completed treatment.  He was supposed to come back for follow-up EGD in 3 months but he failed to follow through.  He came back for follow-up in October 2019.  Clinically he was doing well at that time.  Reports that he was scared about having cirrhosis so he did not come back.  He cut way back on alcohol but then relapsed several months later.  At this time he tells me he does not drink daily but he drinks on the weekends.  No longer consuming hard liquor, drinks beer.  Labs back in January indicated meld of 6.  CT in November 2019 while in the ED showed multiple small lesions in the liver and MRI recommended.  This was done in March 2020, scattered small benign liver cysts seen, no suspicious liver lesions.  No MRI evidence of cirrhosis.  Ultrasound back in 2014 showed nodular hepatic border suggesting cirrhosis.  Platelet count this year normal.  Clinically he is doing well.  Denies any abdominal pain, no edema.  Appetite is good.  No dysphagia, heartburn, vomiting, unintentional weight loss.  Currently working 2 jobs.  Plans to retire in January.  We discussed possibility of H. pylori breath test versus stool antigen to ensure H. pylori eradication.  Patient declines at this time.     Current Outpatient Medications  Medication Sig Dispense Refill  . amLODipine (NORVASC) 5 MG tablet TAKE 1 TABLET(5 MG) BY MOUTH DAILY 30 tablet 5  . Ascorbic Acid (VITAMIN C) 100 MG tablet Take 100 mg by mouth daily.    . cyanocobalamin 100 MCG tablet Take 100 mcg by mouth every  other day.    . fluticasone (FLONASE) 50 MCG/ACT nasal spray SHAKE LIQUID AND USE 2 SPRAYS IN EACH NOSTRIL EVERY DAY 16 g 5  . Multiple Vitamins-Minerals (ONE-A-DAY 50 PLUS PO) Take by mouth every other day.    . tadalafil (CIALIS) 10 MG tablet Take 1 tablet (10 mg total) by mouth daily. as directed 8 tablet 5  . Turmeric (QC TUMERIC COMPLEX) 500 MG CAPS Take by mouth once.     No current facility-administered medications for this visit.     Allergies as of 09/13/2018 - Review Complete 09/13/2018  Allergen Reaction Noted  . Iodine  01/11/2013    ROS:  General: Negative for anorexia, weight loss, fever, chills, fatigue, weakness. ENT: Negative for hoarseness, difficulty swallowing , nasal congestion. CV: Negative for chest pain, angina, palpitations, dyspnea on exertion, peripheral edema.  Respiratory: Negative for dyspnea at rest, dyspnea on exertion, cough, sputum, wheezing.  GI: See history of present illness. GU:  Negative for dysuria, hematuria, urinary incontinence, urinary frequency, nocturnal urination.  Endo: Negative for unusual weight change.    Physical Examination:   BP (!) 143/79   Pulse (!) 58   Temp 97.9 F (36.6 C) (Oral)   Ht 6' (1.829 m)   Wt 180 lb 3.2 oz (81.7 kg)   BMI 24.44 kg/m   General: Well-nourished, well-developed in no acute distress.  Eyes:  No icterus. Mouth: Oropharyngeal mucosa moist and pink , no lesions erythema or exudate. Lungs: Clear to auscultation bilaterally.  Heart: Regular rate and rhythm, no murmurs rubs or gallops.  Abdomen: Bowel sounds are normal, nontender, nondistended, no hepatosplenomegaly or masses, no abdominal bruits or hernia , no rebound or guarding.   Extremities: No lower extremity edema. No clubbing or deformities. Neuro: Alert and oriented x 4   Skin: Warm and dry, no jaundice.   Psych: Alert and cooperative, normal mood and affect.  Labs:  Lab Results  Component Value Date   CREATININE 0.95 04/03/2018   BUN  13 04/03/2018   NA 138 04/03/2018   K 4.5 04/03/2018   CL 97 04/03/2018   CO2 26 04/03/2018   Lab Results  Component Value Date   ALT 29 04/03/2018   AST 16 04/03/2018   ALKPHOS 106 04/03/2018   BILITOT 0.3 04/03/2018   Lab Results  Component Value Date   WBC 7.8 04/03/2018   HGB 13.7 04/03/2018   HCT 37.4 (L) 04/03/2018   MCV 100 (H) 04/03/2018   PLT 249 04/03/2018   Lab Results  Component Value Date   INR 1.0 04/03/2018    Imaging Studies: No results found.

## 2018-09-13 NOTE — Assessment & Plan Note (Signed)
Pleasant 63 year old gentleman presenting for follow-up today of alcoholic liver disease.  In 2014 he had liver ultrasound suggestive of cirrhosis with nodular liver border.  EGD shortly after that with no evidence of portal hypertension.  Cut way back on alcohol consumption.  He had CT of the abdomen for other reasons in the fall, several liver lesions seen in MRI recommended.  MRI abdomen showed hepatic cyst but otherwise unremarkable liver without any evidence of cirrhosis.  Discussed with patient that his liver morphology may be improved related to his decreased alcohol use versus quality of ultrasound in 2014.  He continues to drink alcohol weekly, encouraged him to stop altogether as he is at risk of progression to cirrhosis.  We will update his labs and right upper quadrant ultrasound in March 2021 for additional comparison.  No evidence of cirrhosis at that time, consider backing off on serial imaging.  Will come back and see Dr. Oneida Alar in March 2021.

## 2018-09-13 NOTE — Patient Instructions (Signed)
1. We will plan to update your labs and liver ultrasound in 05/2019.  2. Continue to cut back on alcohol use, including beer, with goal of stopping.  3. Return to the office to see Dr. Oneida Alar in 05/2019.

## 2018-09-14 NOTE — Progress Notes (Signed)
cc'ed to pcp °

## 2018-11-28 ENCOUNTER — Ambulatory Visit (INDEPENDENT_AMBULATORY_CARE_PROVIDER_SITE_OTHER): Payer: 59 | Admitting: Family Medicine

## 2018-11-28 ENCOUNTER — Other Ambulatory Visit: Payer: Self-pay

## 2018-11-28 VITALS — Temp 97.7°F | Ht 72.0 in | Wt 181.4 lb

## 2018-11-28 DIAGNOSIS — M545 Low back pain: Secondary | ICD-10-CM

## 2018-11-28 DIAGNOSIS — R300 Dysuria: Secondary | ICD-10-CM | POA: Diagnosis not present

## 2018-11-28 DIAGNOSIS — Z23 Encounter for immunization: Secondary | ICD-10-CM

## 2018-11-28 DIAGNOSIS — S39011A Strain of muscle, fascia and tendon of abdomen, initial encounter: Secondary | ICD-10-CM

## 2018-11-28 LAB — POCT URINALYSIS DIPSTICK
Spec Grav, UA: 1.015 (ref 1.010–1.025)
pH, UA: 6 (ref 5.0–8.0)

## 2018-11-28 MED ORDER — ETODOLAC 400 MG PO TABS: 400.0000 mg | ORAL_TABLET | Freq: Two times a day (BID) | ORAL | 1 refills | Status: DC

## 2018-11-28 NOTE — Progress Notes (Signed)
   Subjective:  Testing  Patient ID: Gary Harrison, male    DOB: May 18, 1955, 63 y.o.   MRN: [id number]  HPI   Patient arrives with possible kidney stone for 1.5 weeks. Patient states he has history of kidney stones in the past and it felt like it does now.  Pain in the right side,  Hit fairly hard  Woke up last night with it   Worse with motion    Felt like a knife stuck in it  Review of Systems No headache, no major weight loss or weight gain, no chest pain no back pain abdominal pain no change in bowel habits complete ROS otherwise negative     Objective:   Physical Exam  Alert vitals stable, NAD. Blood pressure good on repeat. HEENT normal. Lungs clear. Heart regular rate and rhythm. No true CVA tenderness positive lateral chest wall tenderness to deep palpation abdominal exam benign  Urinalysis normal      Assessment & Plan:  Impression likely chest wall strain.  Patient understandably was concerned about kidney stones with history however with no true CVA tenderness and urinalysis normal and pain more lateral I feel that it is out in the abdominal lateral chest muscle wall.  Discussed anti-inflammatory medicine prescribed

## 2018-12-15 ENCOUNTER — Other Ambulatory Visit: Payer: Self-pay | Admitting: Family Medicine

## 2018-12-20 ENCOUNTER — Other Ambulatory Visit: Payer: Self-pay | Admitting: Family Medicine

## 2018-12-27 ENCOUNTER — Ambulatory Visit (INDEPENDENT_AMBULATORY_CARE_PROVIDER_SITE_OTHER): Payer: 59 | Admitting: Family Medicine

## 2018-12-27 ENCOUNTER — Ambulatory Visit: Payer: 59 | Admitting: Family Medicine

## 2018-12-27 ENCOUNTER — Other Ambulatory Visit: Payer: Self-pay

## 2018-12-27 VITALS — BP 130/78 | Temp 97.9°F | Ht 72.0 in | Wt 181.2 lb

## 2018-12-27 DIAGNOSIS — Z125 Encounter for screening for malignant neoplasm of prostate: Secondary | ICD-10-CM

## 2018-12-27 DIAGNOSIS — I10 Essential (primary) hypertension: Secondary | ICD-10-CM | POA: Diagnosis not present

## 2018-12-27 DIAGNOSIS — Z0000 Encounter for general adult medical examination without abnormal findings: Secondary | ICD-10-CM

## 2018-12-27 MED ORDER — TADALAFIL 20 MG PO TABS: 20.0000 mg | ORAL_TABLET | Freq: Every day | ORAL | 5 refills | Status: DC | PRN

## 2018-12-27 MED ORDER — AMLODIPINE BESYLATE 5 MG PO TABS: ORAL_TABLET | ORAL | 5 refills | Status: DC

## 2018-12-27 NOTE — Progress Notes (Signed)
   Subjective:    Patient ID: Gary Harrison, male    DOB: 04/05/1955, 63 y.o.   MRN: [id number]  HPI The patient comes in today for a wellness visit.    A review of their health history was completed.  A review of medications was also completed.  Any needed refills; yes  Eating habits: good  Falls/  MVA accidents in past few months: not this year  Regular exercise: very active at work  Specialist pt sees on regular basis: no  Preventative health issues were discussed.   Additional concerns: none  Blood pressure medicine and blood pressure levels reviewed today with patient. Compliant with blood pressure medicine. States does not miss a dose. No obvious side effects. Blood pressure generally good when checked elsewhere. Watching salt intake.  Exercising reg    Review of Systems No headache, no major weight loss or weight gain, no chest pain no back pain abdominal pain no change in bowel habits complete ROS otherwise negative     Objective:   Physical Exam Vitals signs reviewed.  Constitutional:      Appearance: He is well-developed.  HENT:     Head: Normocephalic and atraumatic.     Right Ear: External ear normal.     Left Ear: External ear normal.     Nose: Nose normal.  Eyes:     Pupils: Pupils are equal, round, and reactive to light.  Neck:     Musculoskeletal: Normal range of motion and neck supple.     Thyroid: No thyromegaly.  Cardiovascular:     Rate and Rhythm: Normal rate and regular rhythm.     Heart sounds: Normal heart sounds. No murmur.  Pulmonary:     Effort: Pulmonary effort is normal. No respiratory distress.     Breath sounds: Normal breath sounds. No wheezing.  Abdominal:     General: Bowel sounds are normal. There is no distension.     Palpations: Abdomen is soft. There is no mass.     Tenderness: There is no abdominal tenderness.  Genitourinary:    Penis: Normal.   Musculoskeletal: Normal range of motion.  Lymphadenopathy:   Cervical: No cervical adenopathy.  Skin:    General: Skin is warm and dry.     Findings: No erythema.  Neurological:     Mental Status: He is alert.     Motor: No abnormal muscle tone.  Psychiatric:        Behavior: Behavior normal.        Judgment: Judgment normal.           Assessment & Plan:  Impression wellness exam.  Diet discussed.  Exercise discussed.  Blood work discussed.  2.  Hypertension.  Good control discussed to maintain same meds  3.  History of alcohol abuse with cirrhosis.  Diet exercise discussed/blood work ordered/flu shot recommended/medications maintain follow-up in 6 months

## 2018-12-27 NOTE — Patient Instructions (Signed)
Gary Harrison we all need to get serious about this pandemic  Read the article, in [place] now section of the [place] news and record

## 2019-01-04 LAB — HEPATIC FUNCTION PANEL
ALT: 27 IU/L (ref 0–44)
AST: 28 IU/L (ref 0–40)
Albumin: 5 g/dL — ABNORMAL HIGH (ref 3.8–4.8)
Alkaline Phosphatase: 97 IU/L (ref 39–117)
Bilirubin Total: 0.7 mg/dL (ref 0.0–1.2)
Bilirubin, Direct: 0.17 mg/dL (ref 0.00–0.40)
Total Protein: 7.7 g/dL (ref 6.0–8.5)

## 2019-01-04 LAB — BASIC METABOLIC PANEL
BUN/Creatinine Ratio: 9 — ABNORMAL LOW (ref 10–24)
BUN: 9 mg/dL (ref 8–27)
CO2: 26 mmol/L (ref 20–29)
Calcium: 9.9 mg/dL (ref 8.6–10.2)
Chloride: 100 mmol/L (ref 96–106)
Creatinine, Ser: 0.97 mg/dL (ref 0.76–1.27)
GFR calc Af Amer: 97 mL/min/{1.73_m2} (ref >59)
GFR calc non Af Amer: 84 mL/min/{1.73_m2} (ref >59)
Glucose: 96 mg/dL (ref 65–99)
Potassium: 4.6 mmol/L (ref 3.5–5.2)
Sodium: 140 mmol/L (ref 134–144)

## 2019-01-04 LAB — LIPID PANEL
Chol/HDL Ratio: 4.3 ratio (ref 0.0–5.0)
Cholesterol, Total: 160 mg/dL (ref 100–199)
HDL: 37 mg/dL — ABNORMAL LOW (ref >39)
LDL Chol Calc (NIH): 99 mg/dL (ref 0–99)
Triglycerides: 132 mg/dL (ref 0–149)
VLDL Cholesterol Cal: 24 mg/dL (ref 5–40)

## 2019-01-04 LAB — PSA: Prostate Specific Ag, Serum: 0.4 ng/mL (ref 0.0–4.0)

## 2019-01-06 ENCOUNTER — Encounter: Payer: Self-pay | Admitting: Family Medicine

## 2019-01-16 ENCOUNTER — Other Ambulatory Visit: Payer: Self-pay | Admitting: Family Medicine

## 2019-01-18 ENCOUNTER — Other Ambulatory Visit: Payer: Self-pay | Admitting: Family Medicine

## 2019-02-22 ENCOUNTER — Other Ambulatory Visit: Payer: Self-pay

## 2019-02-22 ENCOUNTER — Ambulatory Visit: Payer: 59 | Attending: Internal Medicine

## 2019-02-22 DIAGNOSIS — Z20822 Contact with and (suspected) exposure to covid-19: Secondary | ICD-10-CM

## 2019-02-24 LAB — NOVEL CORONAVIRUS, NAA: SARS-CoV-2, NAA: NOT DETECTED

## 2019-02-25 ENCOUNTER — Telehealth: Payer: Self-pay

## 2019-02-25 NOTE — Telephone Encounter (Signed)
Pt notified of negative COVID-19 results. Understanding verbalized.  Gary Harrison   

## 2019-02-27 ENCOUNTER — Telehealth: Payer: Self-pay

## 2019-02-27 NOTE — Telephone Encounter (Signed)
Pt notified of negative COVID-19 results. Understanding verbalized.  Chasta M Hopkins   

## 2019-04-11 ENCOUNTER — Encounter: Payer: Self-pay | Admitting: Family Medicine

## 2019-04-15 ENCOUNTER — Telehealth: Payer: Self-pay | Admitting: Gastroenterology

## 2019-04-15 ENCOUNTER — Encounter: Payer: Self-pay | Admitting: Gastroenterology

## 2019-04-15 NOTE — Telephone Encounter (Signed)
RECALL FOR ULTRASOUND 

## 2019-04-15 NOTE — Telephone Encounter (Signed)
Letter mailed

## 2019-05-18 ENCOUNTER — Ambulatory Visit: Payer: 59 | Attending: Internal Medicine

## 2019-05-18 DIAGNOSIS — Z23 Encounter for immunization: Secondary | ICD-10-CM

## 2019-05-18 NOTE — Progress Notes (Signed)
   Covid-19 Vaccination Clinic  Name:  Gary Harrison    MRN: [id number] DOB: [birth year]  05/18/2019  Gary Harrison was observed post Covid-19 immunization for 30 minutes based on pre-vaccination screening without incident. He was provided with Vaccine Information Sheet and instruction to access the V-Safe system.   Gary Harrison was instructed to call 911 with any severe reactions post vaccine: Marland Kitchen Difficulty breathing  . Swelling of face and throat  . A fast heartbeat  . A bad rash all over body  . Dizziness and weakness   Immunizations Administered    Name Date Dose VIS Date Route   Moderna COVID-19 Vaccine 05/18/2019 10:49 AM 0.5 mL 02/05/2019 Intramuscular   Manufacturer: Moderna   Lot: [id number]   [place]:[id number]

## 2019-06-19 ENCOUNTER — Ambulatory Visit: Payer: 59 | Attending: Internal Medicine

## 2019-06-19 DIAGNOSIS — Z23 Encounter for immunization: Secondary | ICD-10-CM

## 2019-06-19 NOTE — Progress Notes (Signed)
   Covid-19 Vaccination Clinic  Name:  GILLERMO PALLER    MRN: [id number] DOB: 27-Apr-1955  06/19/2019  Mr. Williamsen was observed post Covid-19 immunization for 15 minutes without incident. He was provided with Vaccine Information Sheet and instruction to access the V-Safe system.   Mr. Rathgeb was instructed to call 911 with any severe reactions post vaccine: Marland Kitchen Difficulty breathing  . Swelling of face and throat  . A fast heartbeat  . A bad rash all over body  . Dizziness and weakness   Immunizations Administered    Name Date Dose VIS Date Route   Moderna COVID-19 Vaccine 06/19/2019 10:45 AM 0.5 mL 02/05/2019 Intramuscular   Manufacturer: Moderna   Lot: [id number]   [place]:[id number]

## 2019-07-17 ENCOUNTER — Other Ambulatory Visit: Payer: Self-pay

## 2019-07-17 ENCOUNTER — Encounter: Payer: Self-pay | Admitting: Family Medicine

## 2019-07-17 ENCOUNTER — Ambulatory Visit: Payer: 59 | Admitting: Family Medicine

## 2019-07-17 VITALS — BP 136/82 | Temp 96.9°F | Wt 180.0 lb

## 2019-07-17 DIAGNOSIS — I10 Essential (primary) hypertension: Secondary | ICD-10-CM | POA: Diagnosis not present

## 2019-07-17 MED ORDER — EPINEPHRINE 0.3 MG/0.3ML IJ SOAJ: 0.3000 mg | INTRAMUSCULAR | 5 refills | Status: DC | PRN

## 2019-07-17 MED ORDER — AMLODIPINE BESYLATE 5 MG PO TABS: ORAL_TABLET | ORAL | 5 refills | Status: DC

## 2019-07-17 MED ORDER — FLUTICASONE PROPIONATE 50 MCG/ACT NA SUSP: NASAL | 5 refills | Status: DC

## 2019-07-17 NOTE — Progress Notes (Signed)
   Subjective:    Patient ID: Gary Harrison, male    DOB: [birth year], 64 y.o.   MRN: [id number]  Hypertension This is a chronic problem. There are no compliance problems.   Pt checks blood pressure when he goes to [employer]. Pt states he can tell if BP is up; no issues lately.   Pt is wanting to know if he should be on Epi-pen. Pt states he has one from a while ago but is needing another one. Epi-Pen not on historical med list.  Blood pressure medicine and blood pressure levels reviewed today with patient. Compliant with blood pressure medicine. States does not miss a dose. No obvious side effects. Blood pressure generally good when checked elsewhere. Watching salt intake.  Trying to stay active  Hx of be  Review of Systems No headache no chest pain no shortness of breath    Objective:   Physical Exam  Alert vitals stable, NAD. Blood pressure good on repeat. HEENT normal. Lungs clear. Heart regular rate and rhythm.       Assessment & Plan:  Impression 1 hypertension discussed good control maintain same meds  2.  Erectile dysfunction  3.  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  EpiPen refilled  Medications refilled diet exercise discussed follow-up in 6 months

## 2019-07-26 IMAGING — MR MR ABDOMEN WO/W CM
12 of 17 series · 29 of 48 positions shown · IV contrast (multihance)
Comparison: 02/03/2018 CT chest, abdomen and pelvis.

CLINICAL DATA: Incidental indeterminate small liver lesions on
recent trauma CT.

EXAM:
MRI ABDOMEN WITHOUT AND WITH CONTRAST
TECHNIQUE: Multiplanar multisequence MR imaging of the abdomen was performed
both before and after the administration of intravenous contrast.
CONTRAST:  17mL MULTIHANCE GADOBENATE DIMEGLUMINE 529 MG/ML IV SOLN

[Series 3: T2 · coronal · 5.0mm · 1.56mm/px · 1 of 36 slices shown (1 of 3)]
[im 1/36]
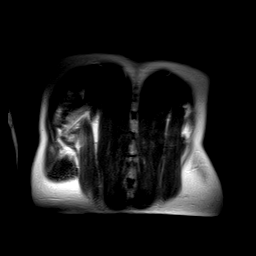

[Series 4: axial tru fisp · axial · 5.0mm · 1.41mm/px · 1 of 37 slices shown]
[im 1/37]
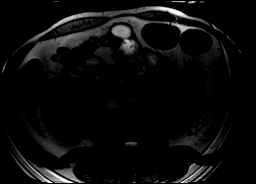

[Series 5: T2 · axial · 6.5mm · 0.74mm/px · 1 of 34 slices shown (2 of 3)]
[im 1/34]
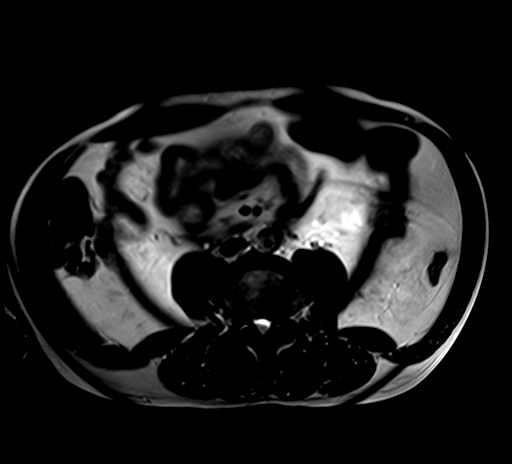

[Series 6: [id number] · axial · 6.0mm · 1.98mm/px · z∈[-116,+110]mm · 2 of 90 slices shown]
[im 1/90]
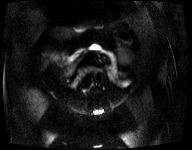
[im 90/90]
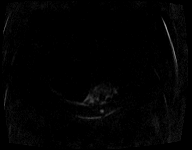

[Series 7: [id number] · axial · 6.0mm · 1.98mm/px · 1 of 30 slices shown]
[im 1/30]
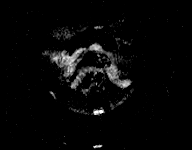

[Series 8: T2 · axial · 5.0mm · 1.41mm/px · 1 of 35 slices shown (3 of 3)]
[im 1/35]
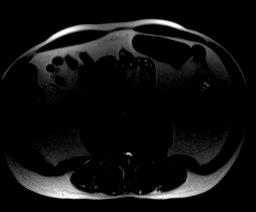

[Series 9: axial in out · axial · 6.0mm · 0.74mm/px · z∈[-132,+88]mm · 2 of 66 slices shown]
[im 1/66]
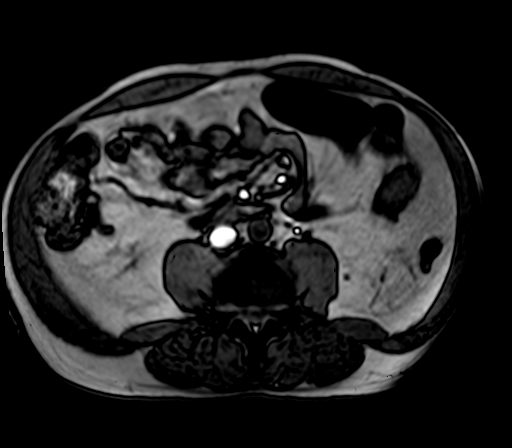
[im 66/66]
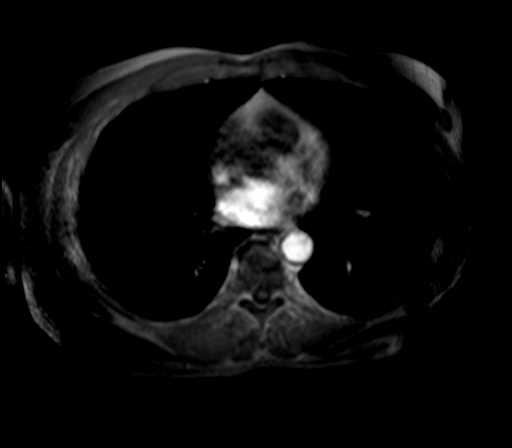

[Series 10: T1 dynamic · axial · non-contrast · 2.2mm · 0.78mm/px · z∈[-135,+91]mm · 4 of 104 slices shown]
[im 1/104]
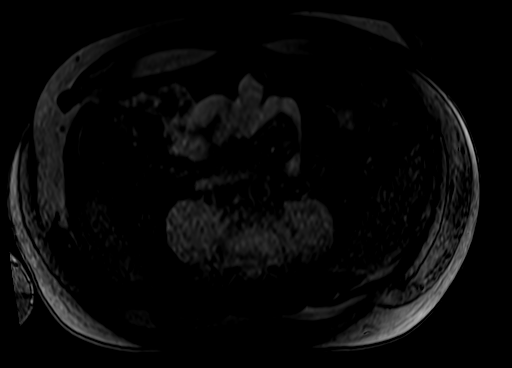
[im 35/104]
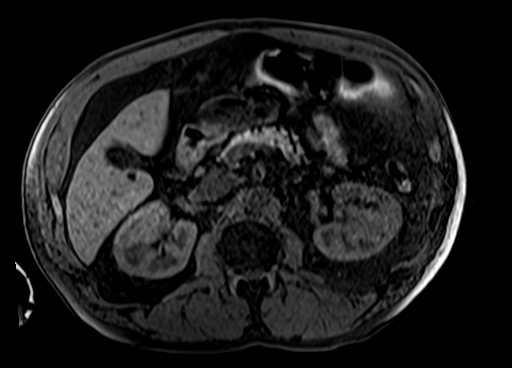
[im 69/104]
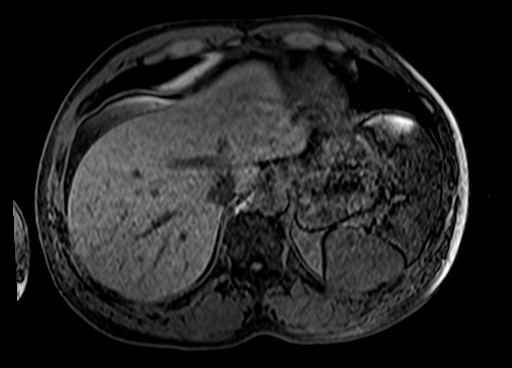
[im 104/104]
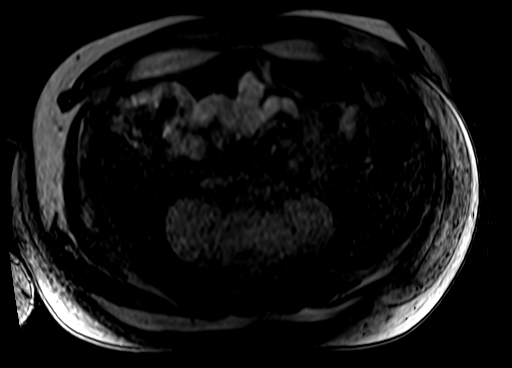

[Series 11: post 25 sec · axial · 2.2mm · 0.78mm/px · z∈[-135,+91]mm · 4 of 104 slices shown]
[im 1/104]
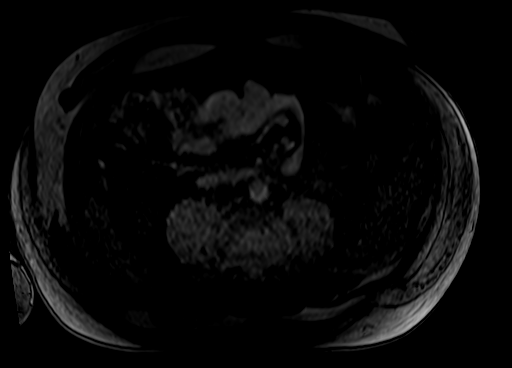
[im 35/104]
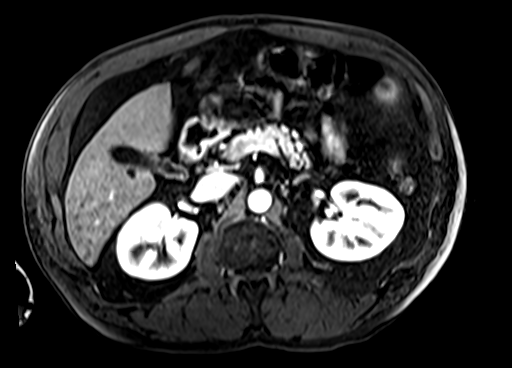
[im 69/104]
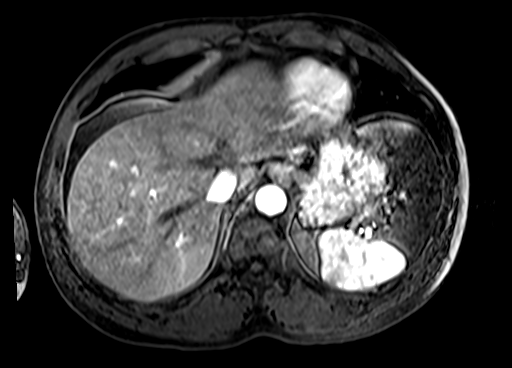
[im 104/104]
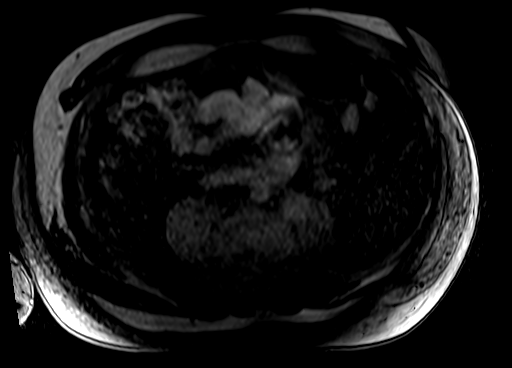

[Series 12: post 25 sec_sub · axial · 2.2mm · 0.78mm/px · z∈[-135,+91]mm · 4 of 104 slices shown]
[im 1/104]
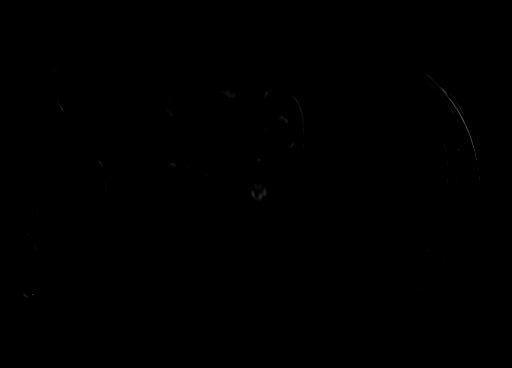
[im 35/104]
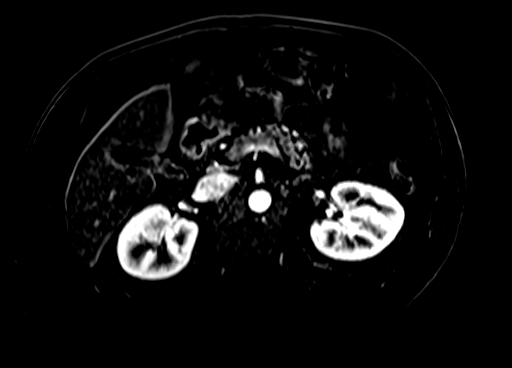
[im 69/104]
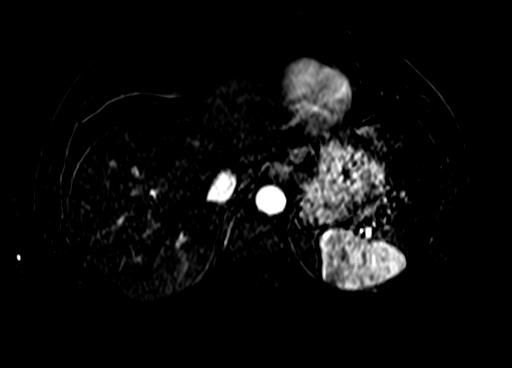
[im 104/104]
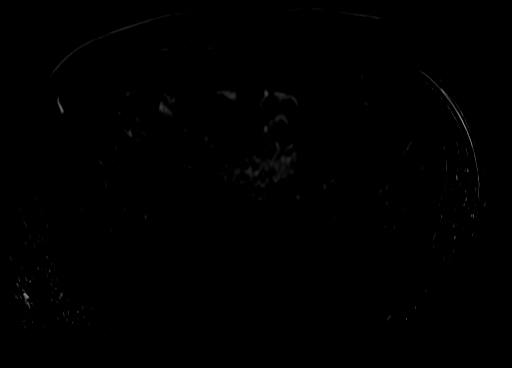

[Series 13: post 45 sec · axial · 2.2mm · 0.78mm/px · z∈[-135,+91]mm · 4 of 104 slices shown]
[im 1/104]
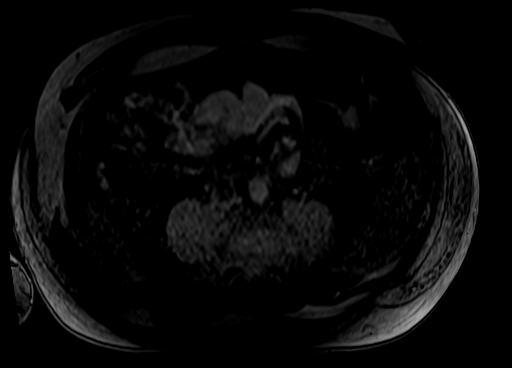
[im 35/104]
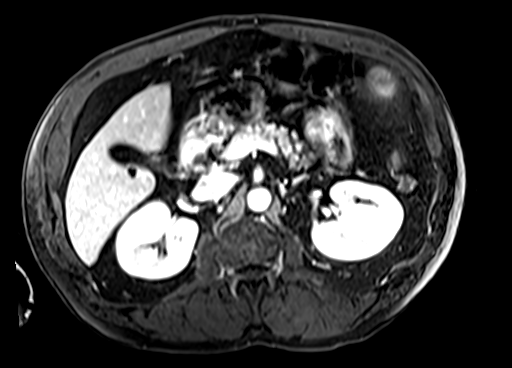
[im 69/104]
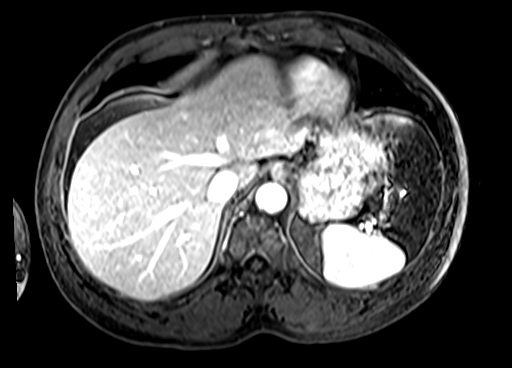
[im 104/104]
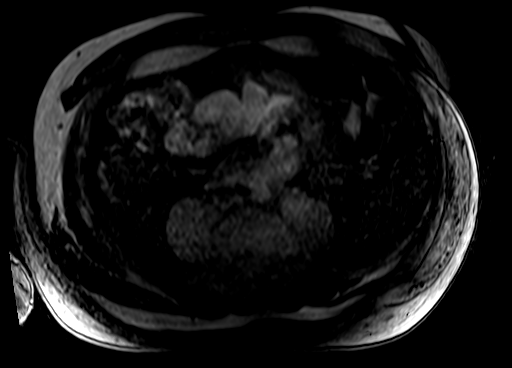

[Series 14: post 45 sec_sub · axial · 2.2mm · 0.78mm/px · z∈[-135,+91]mm · 4 of 104 slices shown]
[im 1/104]
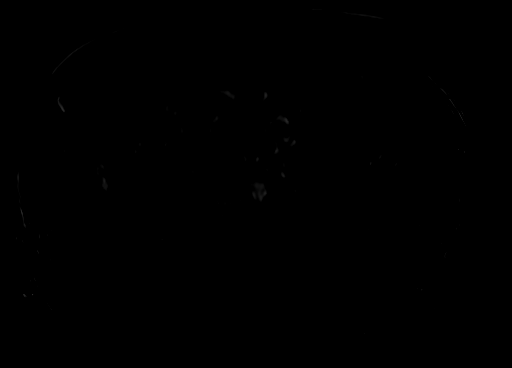
[im 35/104]
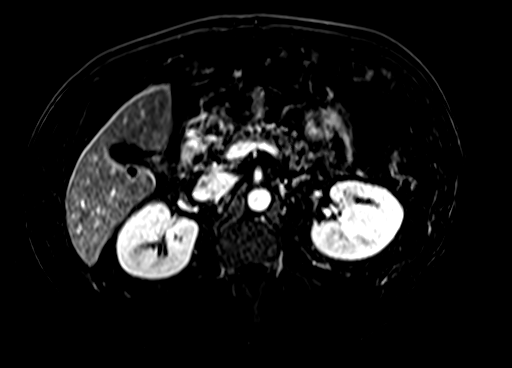
[im 69/104]
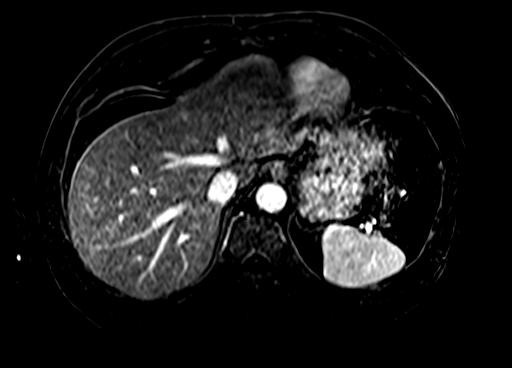
[im 104/104]
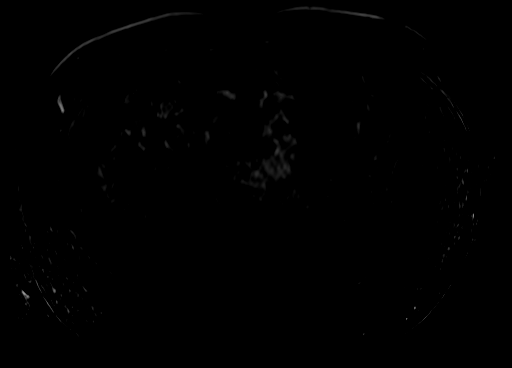

[29 of 48 positions shown; findings below may reference images not displayed]

FINDINGS: Lower chest: No acute abnormality at the lung bases.

Hepatobiliary: Normal liver size. No definite liver surface
irregularity. No hepatic steatosis. There are several (at least 5)
subcentimeter simple liver cysts scattered throughout the liver.
Subcentimeter focus of arterial phase hyperenhancement in the
segment 8 right liver lobe (series 11/image 35) is occult on the
other sequences, compatible with a benign transient hepatic
intensity difference. No suspicious liver masses. Normal gallbladder
with no cholelithiasis. No biliary ductal dilatation. Common bile
duct diameter 3 mm. No choledocholithiasis.

Pancreas: No pancreatic mass or duct dilation.  No pancreas divisum.

Spleen: Normal size. No mass.

Adrenals/Urinary Tract: Normal adrenals. No hydronephrosis.
Subcentimeter simple right renal cysts. No suspicious renal masses.

Stomach/Bowel: Normal non-distended stomach. Visualized small and
large bowel is normal caliber, with no bowel wall thickening.

Vascular/Lymphatic: Atherosclerotic nonaneurysmal thoracic aorta.
Patent portal, splenic, hepatic and renal veins. No pathologically
enlarged lymph nodes in the abdomen.

Other: No abdominal ascites or focal fluid collection.

Musculoskeletal: No aggressive appearing focal osseous lesions.
IMPRESSION: 1. Scattered small benign liver cysts.  No suspicious liver masses.
2.  Aortic Atherosclerosis ([id number]).

## 2020-01-14 ENCOUNTER — Encounter: Payer: Self-pay | Admitting: Family Medicine

## 2020-01-14 ENCOUNTER — Ambulatory Visit: Payer: 59 | Admitting: Family Medicine

## 2020-01-14 ENCOUNTER — Other Ambulatory Visit: Payer: Self-pay

## 2020-01-14 VITALS — BP 138/86 | HR 56 | Temp 97.0°F | Ht 72.0 in | Wt 177.0 lb

## 2020-01-14 DIAGNOSIS — R6882 Decreased libido: Secondary | ICD-10-CM

## 2020-01-14 DIAGNOSIS — R5383 Other fatigue: Secondary | ICD-10-CM | POA: Diagnosis not present

## 2020-01-14 DIAGNOSIS — Z125 Encounter for screening for malignant neoplasm of prostate: Secondary | ICD-10-CM

## 2020-01-14 DIAGNOSIS — I10 Essential (primary) hypertension: Secondary | ICD-10-CM

## 2020-01-14 MED ORDER — AMLODIPINE BESYLATE 5 MG PO TABS: ORAL_TABLET | ORAL | 5 refills | Status: DC

## 2020-01-14 NOTE — Progress Notes (Signed)
Patient ID: Gary Harrison, male    DOB: [birth year], 64 y.o.   MRN: [id number]   Chief Complaint  Patient presents with  . Hypertension   Subjective:    HPI   F/u htn and med check up. Pt would like to discuss testosterone and fatigue.   Pt only took bp med in am, about 29mns before visit. No chest pain, dizziness, or leg swelling.  Pt wondering about fatigue and dec energy. Going on "a while". Had h/o erectile dysfunction. Had prescribed for cialis.  This helped with the ED, however, didn't help with the libido or the energy concerns. Pt more concerned about energy.  Decrease with libido or not sure if it's desire, or the physical activity is causing him fatigue.   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  In history has alcohol abuse in past.  Medical History MBuddiehas a past medical history of ED (erectile dysfunction), H pylori ulcer, Hypertension, and Impaired fasting glucose.   Outpatient Encounter Medications as of 01/14/2020  Medication Sig  . amLODipine (NORVASC) 5 MG tablet TAKE 1 TABLET(5 MG) BY MOUTH DAILY  . Ascorbic Acid (VITAMIN C) 100 MG tablet Take 100 mg by mouth daily.  . cyanocobalamin 100 MCG tablet Take 100 mcg by mouth every other day.  .Marland KitchenEPINEPHrine 0.3 mg/0.3 mL IJ SOAJ injection Inject 0.3 mLs (0.3 mg total) into the muscle as needed for anaphylaxis.  . fluticasone (FLONASE) 50 MCG/ACT nasal spray SHAKE LIQUID AND USE 2 SPRAYS IN EACH NOSTRIL EVERY DAY  . Multiple Vitamins-Minerals (ONE-A-DAY 50 PLUS PO) Take by mouth every other day.  . tadalafil (CIALIS) 20 MG tablet Take 1 tablet (20 mg total) by mouth daily as needed for erectile dysfunction.  . Turmeric (QC TUMERIC COMPLEX) 500 MG CAPS Take by mouth once.  . [DISCONTINUED] amLODipine (NORVASC) 5 MG tablet TAKE 1 TABLET(5 MG) BY MOUTH DAILY   No facility-administered encounter medications on file as of 01/14/2020.     Review of Systems    Constitutional: Positive for fatigue. Negative for chills and fever.  HENT: Negative for congestion, rhinorrhea and sore throat.   Respiratory: Negative for cough, shortness of breath and wheezing.   Cardiovascular: Negative for chest pain and leg swelling.  Gastrointestinal: Negative for abdominal pain, diarrhea, nausea and vomiting.  Genitourinary: Negative for dysuria and frequency.       +dec libido  Skin: Negative for rash.  Neurological: Negative for dizziness, weakness and headaches.     Vitals BP 138/86   Pulse (!) 56   Temp (!) 97 F (36.1 C)   Ht 6' (1.829 m)   Wt 177 lb (80.3 kg)   SpO2 99%   BMI 24.01 kg/m   Objective:   Physical Exam Vitals and nursing note reviewed.  Constitutional:      General: He is not in acute distress.    Appearance: Normal appearance. He is not ill-appearing.  HENT:     Head: Normocephalic.     Nose: Nose normal. No congestion.     Mouth/Throat:     Mouth: Mucous membranes are moist.     Pharynx: No oropharyngeal exudate.  Eyes:     Extraocular Movements: Extraocular movements intact.     Conjunctiva/sclera: Conjunctivae normal.     Pupils: Pupils are equal, round, and reactive to light.  Cardiovascular:     Rate and Rhythm: Regular rhythm. Bradycardia present.     Pulses: Normal pulses.  Heart sounds: Normal heart sounds. No murmur heard.   Pulmonary:     Effort: Pulmonary effort is normal.     Breath sounds: Normal breath sounds. No wheezing, rhonchi or rales.  Musculoskeletal:        General: Normal range of motion.     Right lower leg: No edema.     Left lower leg: No edema.  Skin:    General: Skin is warm and dry.     Findings: No rash.  Neurological:     General: No focal deficit present.     Mental Status: He is alert and oriented to person, place, and time.     Cranial Nerves: No cranial nerve deficit.  Psychiatric:        Mood and Affect: Mood normal.        Behavior: Behavior normal.        Thought  Content: Thought content normal.        Judgment: Judgment normal.      Assessment and Plan   1. Essential hypertension, benign - CBC - CMP14+EGFR - Lipid panel - amLODipine (NORVASC) 5 MG tablet; TAKE 1 TABLET(5 MG) BY MOUTH DAILY  Dispense: 30 tablet; Refill: 5  2. Fatigue, unspecified type - TSH - Testosterone,Free and Total  3. Decreased libido - Testosterone,Free and Total  4. Screening PSA (prostate specific antigen) - PSA; Future   Pt has allergy to iodine or shrimp has epi pen.  htn-suboptimal.  Pt to cont norvasc 41m.  Cont to watch salt in diet and recheck on next visit.  Also if drinking large amt of alcohol this could be contributing to his blood pressure.  Fatigue/ decreased libido- pt requesting labs to evaluate this further. Ordered tsh, cbc, cmp, and testosterone.  F/u 671mo

## 2020-01-16 LAB — CBC
Hematocrit: 44.2 % (ref 37.5–51.0)
Hemoglobin: 15 g/dL (ref 13.0–17.7)
MCH: 34.5 pg — ABNORMAL HIGH (ref 26.6–33.0)
MCHC: 33.9 g/dL (ref 31.5–35.7)
MCV: 102 fL — ABNORMAL HIGH (ref 79–97)
Platelets: 261 10*3/uL (ref 150–450)
RBC: 4.35 x10E6/uL (ref 4.14–5.80)
RDW: 11.3 % — ABNORMAL LOW (ref 11.6–15.4)
WBC: 9.3 10*3/uL (ref 3.4–10.8)

## 2020-01-16 LAB — TESTOSTERONE,FREE AND TOTAL
Testosterone, Free: 14.3 pg/mL (ref 6.6–18.1)
Testosterone: 405 ng/dL (ref 264–916)

## 2020-01-16 LAB — CMP14+EGFR
ALT: 53 IU/L — ABNORMAL HIGH (ref 0–44)
AST: 32 IU/L (ref 0–40)
Albumin/Globulin Ratio: 1.7 (ref 1.2–2.2)
Albumin: 4.9 g/dL — ABNORMAL HIGH (ref 3.8–4.8)
Alkaline Phosphatase: 111 IU/L (ref 44–121)
BUN/Creatinine Ratio: 13 (ref 10–24)
BUN: 12 mg/dL (ref 8–27)
Bilirubin Total: 0.4 mg/dL (ref 0.0–1.2)
CO2: 25 mmol/L (ref 20–29)
Calcium: 9.8 mg/dL (ref 8.6–10.2)
Chloride: 100 mmol/L (ref 96–106)
Creatinine, Ser: 0.9 mg/dL (ref 0.76–1.27)
GFR calc Af Amer: 105 mL/min/{1.73_m2} (ref >59)
GFR calc non Af Amer: 91 mL/min/{1.73_m2} (ref >59)
Globulin, Total: 2.9 g/dL (ref 1.5–4.5)
Glucose: 106 mg/dL — ABNORMAL HIGH (ref 65–99)
Potassium: 4.3 mmol/L (ref 3.5–5.2)
Sodium: 140 mmol/L (ref 134–144)
Total Protein: 7.8 g/dL (ref 6.0–8.5)

## 2020-01-16 LAB — LIPID PANEL
Chol/HDL Ratio: 4.4 ratio (ref 0.0–5.0)
Cholesterol, Total: 192 mg/dL (ref 100–199)
HDL: 44 mg/dL (ref >39)
LDL Chol Calc (NIH): 121 mg/dL — ABNORMAL HIGH (ref 0–99)
Triglycerides: 154 mg/dL — ABNORMAL HIGH (ref 0–149)
VLDL Cholesterol Cal: 27 mg/dL (ref 5–40)

## 2020-01-16 LAB — TSH: TSH: 0.967 u[IU]/mL (ref 0.450–4.500)

## 2020-02-06 ENCOUNTER — Ambulatory Visit: Payer: 59 | Attending: Internal Medicine

## 2020-02-06 DIAGNOSIS — Z23 Encounter for immunization: Secondary | ICD-10-CM

## 2020-02-06 NOTE — Progress Notes (Signed)
   Covid-19 Vaccination Clinic  Name:  Gary Harrison    MRN: [id number] DOB: 26-Feb-1956  02/06/2020  [name] was observed post Covid-19 immunization for 15 minutes without incident. He was provided with Vaccine Information Sheet and instruction to access the V-Safe system.   [name] was instructed to call 911 with any severe reactions post vaccine: Marland Kitchen Difficulty breathing  . Swelling of face and throat  . A fast heartbeat  . A bad rash all over body  . Dizziness and weakness   Immunizations Administered    No immunizations on file.      Covid-19 Vaccination Clinic  Name:  Gary Harrison    MRN: [id number] DOB: 03-Feb-1956  02/06/2020  [name] was observed post Covid-19 immunization for 15 minutes without incident. He was provided with Vaccine Information Sheet and instruction to access the V-Safe system.   [name] was instructed to call 911 with any severe reactions post vaccine: Marland Kitchen Difficulty breathing  . Swelling of face and throat  . A fast heartbeat  . A bad rash all over body  . Dizziness and weakness   Immunizations Administered    No immunizations on file.

## 2020-02-21 ENCOUNTER — Ambulatory Visit (INDEPENDENT_AMBULATORY_CARE_PROVIDER_SITE_OTHER): Payer: 59 | Admitting: Family Medicine

## 2020-02-21 VITALS — HR 63 | Temp 98.0°F | Resp 16

## 2020-02-21 DIAGNOSIS — H66002 Acute suppurative otitis media without spontaneous rupture of ear drum, left ear: Secondary | ICD-10-CM | POA: Diagnosis not present

## 2020-02-21 DIAGNOSIS — R051 Acute cough: Secondary | ICD-10-CM | POA: Diagnosis not present

## 2020-02-21 MED ORDER — AMOXICILLIN-POT CLAVULANATE 875-125 MG PO TABS: 1.0000 | ORAL_TABLET | Freq: Two times a day (BID) | ORAL | 0 refills | Status: DC

## 2020-02-21 NOTE — Progress Notes (Signed)
Patient ID: Gary Harrison, male    DOB: Jan 16, 1956, 64 y.o.   MRN: [id number]   Chief Complaint  Patient presents with  . Cough    Sinus drainage, congestion, headache and ear pain for 3-4 weeks-  worse in am   Subjective:  JK:DTOIZ drainage, congestion, cough  This is a new problem.  Reports today as an outside acute visit with a complaint of sinus drainage, chest congestion, and cough.  Symptoms are worse in the morning.  Also reports headache that comes and goes and ear pain.  Pertinent negatives include no fever, no shortness of breath, no abdominal pain.  Reports he still has his sense of taste and smell.  Feels this is a sinus infection.    Medical History Gary Harrison has a past medical history of ED (erectile dysfunction), H pylori ulcer, Hypertension, and Impaired fasting glucose.   Outpatient Encounter Medications as of 02/21/2020  Medication Sig  . amLODipine (NORVASC) 5 MG tablet TAKE 1 TABLET(5 MG) BY MOUTH DAILY  . amoxicillin-clavulanate (AUGMENTIN) 875-125 MG tablet Take 1 tablet by mouth 2 (two) times daily.  . Ascorbic Acid (VITAMIN C) 100 MG tablet Take 100 mg by mouth daily.  . cyanocobalamin 100 MCG tablet Take 100 mcg by mouth every other day.  Marland Kitchen EPINEPHrine 0.3 mg/0.3 mL IJ SOAJ injection Inject 0.3 mLs (0.3 mg total) into the muscle as needed for anaphylaxis.  . fluticasone (FLONASE) 50 MCG/ACT nasal spray SHAKE LIQUID AND USE 2 SPRAYS IN EACH NOSTRIL EVERY DAY  . Multiple Vitamins-Minerals (ONE-A-DAY 50 PLUS PO) Take by mouth every other day.  . tadalafil (CIALIS) 20 MG tablet Take 1 tablet (20 mg total) by mouth daily as needed for erectile dysfunction.  . Turmeric (QC TUMERIC COMPLEX) 500 MG CAPS Take by mouth once.   No facility-administered encounter medications on file as of 02/21/2020.     Review of Systems  Constitutional: Negative for chills and fever.  HENT: Positive for congestion and postnasal drip. Negative for ear pain, sinus pressure and sinus  pain.   Respiratory: Negative for shortness of breath.   Cardiovascular: Negative for chest pain.  Gastrointestinal: Negative for abdominal pain.     Vitals Pulse 63   Temp 98 F (36.7 C)   Resp 16   SpO2 95%   Objective:   Physical Exam Vitals reviewed.  Constitutional:      General: He is not in acute distress. HENT:     Right Ear: Tympanic membrane normal.     Left Ear: Tympanic membrane is erythematous.     Nose:     Right Sinus: No maxillary sinus tenderness or frontal sinus tenderness.     Left Sinus: No maxillary sinus tenderness or frontal sinus tenderness.     Mouth/Throat:     Pharynx: Posterior oropharyngeal erythema present. No oropharyngeal exudate or uvula swelling.     Tonsils: No tonsillar exudate. 1+ on the right. 1+ on the left.  Cardiovascular:     Rate and Rhythm: Normal rate and regular rhythm.     Heart sounds: Normal heart sounds.  Pulmonary:     Effort: Pulmonary effort is normal.     Breath sounds: Normal breath sounds.  Skin:    General: Skin is warm and dry.  Neurological:     General: No focal deficit present.     Mental Status: He is alert.  Psychiatric:        Behavior: Behavior normal.      Assessment and  Plan   1. Non-recurrent acute suppurative otitis media of left ear without spontaneous rupture of tympanic membrane - amoxicillin-clavulanate (AUGMENTIN) 875-125 MG tablet; Take 1 tablet by mouth 2 (two) times daily.  Dispense: 20 tablet; Refill: 0   Due to symptoms, will test for Covid 19 infection.  Reports that he received his Covid vaccine booster less than 2 weeks ago.  No maxillary or frontal sinus pain or pressure, left TM erythematous, will treat for acute otitis media of the left ear.  Agrees with plan of care discussed today. Understands warning signs to seek further care: Fever, chills, chest pain, shortness of breath, any significant change in health. Understands to follow-up if symptoms do not improve, or worsen.   Will notify once results of Covid 19 test become available.  Understands to take the antibiotic for the full 10 days.  Pecolia Ades, FNP-C

## 2020-02-23 ENCOUNTER — Encounter: Payer: Self-pay | Admitting: Family Medicine

## 2020-02-23 LAB — NOVEL CORONAVIRUS, NAA: SARS-CoV-2, NAA: NOT DETECTED

## 2020-02-23 LAB — SARS-COV-2, NAA 2 DAY TAT

## 2020-03-17 ENCOUNTER — Telehealth: Payer: Self-pay

## 2020-03-17 MED ORDER — FLUTICASONE PROPIONATE 50 MCG/ACT NA SUSP: NASAL | 2 refills | Status: DC

## 2020-03-17 NOTE — Telephone Encounter (Signed)
Pt needs refill on fluticasone (FLONASE) 50 MCG/ACT nasal spray  [facility] [phone] - [place], [place] AT [address]   Pt call back [phone]

## 2020-03-17 NOTE — Telephone Encounter (Signed)
Pt contacted and verbalized understanding.  

## 2020-03-17 NOTE — Telephone Encounter (Signed)
Please advise. Thank you

## 2020-03-25 ENCOUNTER — Other Ambulatory Visit: Payer: Self-pay

## 2020-03-25 ENCOUNTER — Ambulatory Visit (INDEPENDENT_AMBULATORY_CARE_PROVIDER_SITE_OTHER): Payer: 59 | Admitting: Family Medicine

## 2020-03-25 ENCOUNTER — Encounter: Payer: Self-pay | Admitting: Family Medicine

## 2020-03-25 VITALS — BP 154/86 | HR 51 | Temp 96.9°F | Ht 72.0 in | Wt 176.2 lb

## 2020-03-25 DIAGNOSIS — R079 Chest pain, unspecified: Secondary | ICD-10-CM

## 2020-03-25 DIAGNOSIS — S29011A Strain of muscle and tendon of front wall of thorax, initial encounter: Secondary | ICD-10-CM | POA: Insufficient documentation

## 2020-03-25 NOTE — Progress Notes (Signed)
Pt here for chest pain. Pt thinks he had done to much lifting. Pt has been taking Mucinex due to thinking congestion was causing pain. Pain gets worse with movement. Pt did lots of lifting with Christmas decorations and furniture. Going on about one week. Chest will tighten at times when he moves. Pt has also been taking Tylenol. Tylenol is giving relief.     Patient ID: Gary Harrison, male    DOB: September 20, 1955, 65 y.o.   MRN: [id number]   Chief Complaint  Patient presents with  . Chest Pain   Subjective:  CC: chest pain since Christmas  This is a new problem.  Presents today with a complaint of chest pain since around Christmas.  Has had a recent upper respiratory infection with chest congestion, was lifting some heavy items on Christmas Day and started having right-sided chest discomfort.  Reports pain is worse with movement and with pushing himself up from the chair.  Points to the right side of his chest.  Does not hurt to take a deep breath.  Has been taking Mucinex for the recent upper respiratory infection, blames his elevated blood pressure on that.  Tried Tylenol for the last 2 days which did help this chest discomfort.    Medical History Thaniel has a past medical history of ED (erectile dysfunction), H pylori ulcer, Hypertension, and Impaired fasting glucose.   Outpatient Encounter Medications as of 03/25/2020  Medication Sig  . amLODipine (NORVASC) 5 MG tablet TAKE 1 TABLET(5 MG) BY MOUTH DAILY  . Ascorbic Acid (VITAMIN C) 100 MG tablet Take 100 mg by mouth daily.  . cyanocobalamin 100 MCG tablet Take 100 mcg by mouth every other day.  Marland Kitchen EPINEPHrine 0.3 mg/0.3 mL IJ SOAJ injection Inject 0.3 mLs (0.3 mg total) into the muscle as needed for anaphylaxis.  . fluticasone (FLONASE) 50 MCG/ACT nasal spray SHAKE LIQUID AND USE 2 SPRAYS IN EACH NOSTRIL EVERY DAY  . Multiple Vitamins-Minerals (ONE-A-DAY 50 PLUS PO) Take by mouth every other day.  . tadalafil (CIALIS) 20 MG tablet Take 1  tablet (20 mg total) by mouth daily as needed for erectile dysfunction.  . Turmeric 500 MG CAPS Take by mouth once.  . [DISCONTINUED] amoxicillin-clavulanate (AUGMENTIN) 875-125 MG tablet Take 1 tablet by mouth 2 (two) times daily.   No facility-administered encounter medications on file as of 03/25/2020.     Review of Systems  Constitutional: Negative for chills and fever.  HENT:       Leftover cough from URI in December  Respiratory: Negative for shortness of breath.        Occasional cough  Cardiovascular: Positive for chest pain.  Gastrointestinal: Negative for diarrhea, nausea and vomiting.  Musculoskeletal: Negative for back pain and neck pain.     Vitals BP (!) 154/86   Pulse (!) 51   Temp (!) 96.9 F (36.1 C)   Ht 6' (1.829 m)   Wt 176 lb 3.2 oz (79.9 kg)   SpO2 98%   BMI 23.90 kg/m   Objective:   Physical Exam Vitals reviewed.  Cardiovascular:     Rate and Rhythm: Normal rate and regular rhythm.     Heart sounds: Normal heart sounds.  Pulmonary:     Effort: Pulmonary effort is normal.  Chest:     Chest wall: Tenderness present.     Comments: Right side of chest Abdominal:     General: Bowel sounds are normal.  Skin:    General: Skin is warm and dry.  Neurological:     General: No focal deficit present.     Mental Status: He is alert.  Psychiatric:        Behavior: Behavior normal.      Assessment and Plan   1. Chest pain, unspecified type - EKG 12-Lead - Ambulatory referral to Cardiology  2. Muscle strain of chest wall, initial encounter   EKG performed, does not show any acute ischemia or ectopy.  Reviewed with Dr. Sallee Lange, concerned for possible LV dysfunction, will make referral to cardiology.  Blood pressure in office today elevated, upon recheck continues to be elevated it was 160/94.  He is instructed to recheck his blood pressure daily to ensure it normalizes, he will let us know if it does not return close to 130/80.  He will  continue taking Tylenol 1000 mg every 8 hours, instructed to never take more than 3000 mg in 24 hours.  He will avoid alcohol consumption, he is states that he is working on decreasing the amount of alcohol that he consumes.  He will check his blood pressure daily and let us know if it does not return to normal.  Cardiology consult sent.  He will stop taking Mucinex.  Agrees with plan of care discussed today. Understands warning signs to seek further care: chest pain, shortness of breath, any significant change in health.  Understands to follow-up with cardiology.  Follow-up at this office for medication management for hypertension with Dr. Lovena Le around May 2022, sooner if anything changes.    Chalmers Guest, NP 03/25/2020

## 2020-03-25 NOTE — Patient Instructions (Addendum)
Take two  Tylenol 500 mg every 8 hours (do not take more than 3000 mg in 24 hours).  Avoid alcohol consumption.  Please check blood pressure every day for the next week and let us know if it doesn't get back to 130/80.    Hypertension, Adult Hypertension is another name for high blood pressure. High blood pressure forces your heart to work harder to pump blood. This can cause problems over time. There are two numbers in a blood pressure reading. There is a top number (systolic) over a bottom number (diastolic). It is best to have a blood pressure that is below 120/80. Healthy choices can help lower your blood pressure, or you may need medicine to help lower it. What are the causes? The cause of this condition is not known. Some conditions may be related to high blood pressure. What increases the risk?  Smoking.  Having type 2 diabetes mellitus, high cholesterol, or both.  Not getting enough exercise or physical activity.  Being overweight.  Having too much fat, sugar, calories, or salt (sodium) in your diet.  Drinking too much alcohol.  Having long-term (chronic) kidney disease.  Having a family history of high blood pressure.  Age. Risk increases with age.  Race. You may be at higher risk if you are African American.  Gender. Men are at higher risk than women before age 83. After age 63, women are at higher risk than men.  Having obstructive sleep apnea.  Stress. What are the signs or symptoms?  High blood pressure may not cause symptoms. Very high blood pressure (hypertensive crisis) may cause: ? Headache. ? Feelings of worry or nervousness (anxiety). ? Shortness of breath. ? Nosebleed. ? A feeling of being sick to your stomach (nausea). ? Throwing up (vomiting). ? Changes in how you see. ? Very bad chest pain. ? Seizures. How is this treated?  This condition is treated by making healthy lifestyle changes, such as: ? Eating healthy foods. ? Exercising  more. ? Drinking less alcohol.  Your health care provider may prescribe medicine if lifestyle changes are not enough to get your blood pressure under control, and if: ? Your top number is above 130. ? Your bottom number is above 80.  Your personal target blood pressure may vary. Follow these instructions at home: Eating and drinking  If told, follow the DASH eating plan. To follow this plan: ? Fill one half of your plate at each meal with fruits and vegetables. ? Fill one fourth of your plate at each meal with whole grains. Whole grains include whole-wheat pasta, brown rice, and whole-grain bread. ? Eat or drink low-fat dairy products, such as skim milk or low-fat yogurt. ? Fill one fourth of your plate at each meal with low-fat (lean) proteins. Low-fat proteins include fish, chicken without skin, eggs, beans, and tofu. ? Avoid fatty meat, cured and processed meat, or chicken with skin. ? Avoid pre-made or processed food.  Eat less than 1,500 mg of salt each day.  Do not drink alcohol if: ? Your doctor tells you not to drink. ? You are pregnant, may be pregnant, or are planning to become pregnant.  If you drink alcohol: ? Limit how much you use to:  0-1 drink a day for women.  0-2 drinks a day for men. ? Be aware of how much alcohol is in your drink. In the [place] one drink equals one 12 oz bottle of beer (355 mL), one 5 oz glass of wine (148  mL), or one 1 oz glass of hard liquor (44 mL).   Lifestyle  Work with your doctor to stay at a healthy weight or to lose weight. Ask your doctor what the best weight is for you.  Get at least 30 minutes of exercise most days of the week. This may include walking, swimming, or biking.  Get at least 30 minutes of exercise that strengthens your muscles (resistance exercise) at least 3 days a week. This may include lifting weights or doing Pilates.  Do not use any products that contain nicotine or tobacco, such as cigarettes, e-cigarettes,  and chewing tobacco. If you need help quitting, ask your doctor.  Check your blood pressure at home as told by your doctor.  Keep all follow-up visits as told by your doctor. This is important.   Medicines  Take over-the-counter and prescription medicines only as told by your doctor. Follow directions carefully.  Do not skip doses of blood pressure medicine. The medicine does not work as well if you skip doses. Skipping doses also puts you at risk for problems.  Ask your doctor about side effects or reactions to medicines that you should watch for. Contact a doctor if you:  Think you are having a reaction to the medicine you are taking.  Have headaches that keep coming back (recurring).  Feel dizzy.  Have swelling in your ankles.  Have trouble with your vision. Get help right away if you:  Get a very bad headache.  Start to feel mixed up (confused).  Feel weak or numb.  Feel faint.  Have very bad pain in your: ? Chest. ? Belly (abdomen).  Throw up more than once.  Have trouble breathing. Summary  Hypertension is another name for high blood pressure.  High blood pressure forces your heart to work harder to pump blood.  For most people, a normal blood pressure is less than 120/80.  Making healthy choices can help lower blood pressure. If your blood pressure does not get lower with healthy choices, you may need to take medicine. This information is not intended to replace advice given to you by your health care provider. Make sure you discuss any questions you have with your health care provider. Document Revised: 11/01/2017 Document Reviewed: 11/01/2017 Elsevier Patient Education  2021 [employer].

## 2020-03-27 NOTE — Progress Notes (Signed)
Birdie Hopes NP Reason for referral-chest pain  HPI: 65 yo male for evaluation of CP at request of Pecolia Ades NP.  No prior cardiac history.  He typically does not have dyspnea on exertion, orthopnea, PND, pedal edema, palpitations, syncope or chest pain.  Patient had a URI recently with associated productive cough.  He was treated with antibiotics with improvement.  He was lifting Christmas decorations and developed a pain in his right chest that increased with lying in certain positions and with certain movements.  There was improvement with Tylenol.  No associated symptoms and no radiation.  His symptoms have resolved over the past 24 to 48 hours.  Cardiology now asked to evaluate.  Current Outpatient Medications  Medication Sig Dispense Refill  . amLODipine (NORVASC) 5 MG tablet TAKE 1 TABLET(5 MG) BY MOUTH DAILY 30 tablet 5  . Ascorbic Acid (VITAMIN C) 100 MG tablet Take 100 mg by mouth daily.    . cyanocobalamin 100 MCG tablet Take 100 mcg by mouth every other day.    Marland Kitchen EPINEPHrine 0.3 mg/0.3 mL IJ SOAJ injection Inject 0.3 mLs (0.3 mg total) into the muscle as needed for anaphylaxis. 1 each 5  . fluticasone (FLONASE) 50 MCG/ACT nasal spray SHAKE LIQUID AND USE 2 SPRAYS IN EACH NOSTRIL EVERY DAY 16 g 2  . Multiple Vitamins-Minerals (ONE-A-DAY 50 PLUS PO) Take by mouth every other day.    . tadalafil (CIALIS) 20 MG tablet Take 1 tablet (20 mg total) by mouth daily as needed for erectile dysfunction. 8 tablet 5  . Turmeric 500 MG CAPS Take by mouth once.     No current facility-administered medications for this visit.    Allergies  Allergen Reactions  . Iodine      Past Medical History:  Diagnosis Date  . ED (erectile dysfunction)   . H pylori ulcer    2014, multiple gastric and duodenal ulcers due to H. pylori.  Treated with amoxicillin, Biaxin, PPI  . Hypertension    no meds currently  . Impaired fasting glucose     Past Surgical History:  Procedure  Laterality Date  . COLONOSCOPY  10/18/2007     ZDG:LOVFI descending colon polyps.  The polyps ranged from 8 mm  to 1.2 cm.  One pedunculated sigmoid colon polyp removed via snare cautery; others via cold forceps. One 3-mm cecal polyp removed via cold forceps.  Otherwise, no masses, inflammatory changes, diverticular, or arteriovenous malformations seen. simple adenomas. Needs surveillance 2014  . COLONOSCOPY WITH ESOPHAGOGASTRODUODENOSCOPY (EGD) N/A 01/25/2013   Dr. Oneida Alar: Multiple small ulcers seen in the gastric antrum as well as the duodenum.  Biopsies showed H. pylori gastritis.  Need to come back for 52-month follow-up EGD but he did not have this done.  Colonoscopy showed small internal hemorrhoids, 8 colon polyps removed only one was a simple adenoma.  Next colonoscopy in 10 years.    Social History   Socioeconomic History  . Marital status: Married    Spouse name: Not on file  . Number of children: 4  . Years of education: Not on file  . Highest education level: Not on file  Occupational History    Comment: Retired  Tobacco Use  . Smoking status: Current Every Day Smoker    Packs/day: 0.50    Types: Cigarettes  . Smokeless tobacco: Never Used  Vaping Use  . Vaping Use: Not on file  Substance and Sexual Activity  . Alcohol use: Yes    Comment: Consumes  a mix drinking at least 3 beers every day.,  Remote DUI in the 19s.  . Drug use: No  . Sexual activity: Not on file  Other Topics Concern  . Not on file  Social History Narrative  . Not on file   Social Determinants of Health   Financial Resource Strain: Not on file  Food Insecurity: Not on file  Transportation Needs: Not on file  Physical Activity: Not on file  Stress: Not on file  Social Connections: Not on file  Intimate Partner Violence: Not on file    Family History  Problem Relation Age of Onset  . Cancer Mother   . Cancer Father   . Colon cancer Neg Hx     ROS: no fevers or chills, productive cough,  hemoptysis, dysphasia, odynophagia, melena, hematochezia, dysuria, hematuria, rash, seizure activity, orthopnea, PND, pedal edema, claudication. Remaining systems are negative.  Physical Exam:   Blood pressure (!) 153/77, pulse 61, height 5\' 11"  (1.803 m), weight 178 lb 12.8 oz (81.1 kg), SpO2 99 %.  General:  Well developed/well nourished in NAD Skin warm/dry Patient not depressed No peripheral clubbing Back-normal HEENT-normal/normal eyelids Neck supple/normal carotid upstroke bilaterally; no bruits; no JVD; no thyromegaly chest - CTA/ normal expansion CV - RRR/normal S1 and S2; no murmurs, rubs or gallops;  PMI nondisplaced Abdomen -NT/ND, no HSM, no mass, + bowel sounds, no bruit 2+ femoral pulses, no bruits Ext-no edema, chords, 2+ DP Neuro-grossly nonfocal  ECG - 03/25/20-sinus bradycardia, LVH; personally reviewed  A/P  1 chest pain-symptoms most consistent with musculoskeletal pain.  It began with lifting Christmas decorations and increased with certain movements.  They improved with Tylenol.  He otherwise has not had exertional chest pain and no dyspnea.  Electrocardiogram shows no ST changes.  I do not think we need to pursue further evaluation unless he has more symptoms in the future.  2 Hypertension -blood pressure elevated.  I have asked him to increase his amlodipine from 5 to 10 mg daily and follow-up with primary care for this issue.  Goal systolic blood pressure 599 or less and diastolic 85 or less.  3 tobacco abuse-patient counseled on discontinuing.  Kirk Ruths, MD

## 2020-03-30 ENCOUNTER — Encounter: Payer: Self-pay | Admitting: Cardiology

## 2020-03-30 ENCOUNTER — Ambulatory Visit: Payer: 59 | Admitting: Cardiology

## 2020-03-30 ENCOUNTER — Other Ambulatory Visit: Payer: Self-pay

## 2020-03-30 VITALS — BP 153/77 | HR 61 | Ht 71.0 in | Wt 178.8 lb

## 2020-03-30 DIAGNOSIS — R072 Precordial pain: Secondary | ICD-10-CM | POA: Diagnosis not present

## 2020-03-30 DIAGNOSIS — Z720 Tobacco use: Secondary | ICD-10-CM

## 2020-03-30 DIAGNOSIS — I10 Essential (primary) hypertension: Secondary | ICD-10-CM | POA: Diagnosis not present

## 2020-03-30 MED ORDER — AMLODIPINE BESYLATE 10 MG PO TABS: 10.0000 mg | ORAL_TABLET | Freq: Every day | ORAL | 3 refills | Status: AC

## 2020-03-30 NOTE — Patient Instructions (Signed)
INCREASE AMLODIPINE TO 10 MG ONCE DAILY= 2 OF THE 5 MG TABLETS ONCE DAILY   Follow-Up: At [facility], you and your health needs are our priority.  As part of our continuing mission to provide you with exceptional heart care, we have created designated Provider Care Teams.  These Care Teams include your primary Cardiologist (physician) and Advanced Practice Providers (APPs -  Physician Assistants and Nurse Practitioners) who all work together to provide you with the care you need, when you need it.  We recommend signing up for the patient portal called "MyChart".  Sign up information is provided on this After Visit Summary.  MyChart is used to connect with patients for Virtual Visits (Telemedicine).  Patients are able to view lab/test results, encounter notes, upcoming appointments, etc.  Non-urgent messages can be sent to your provider as well.   To learn more about what you can do with MyChart, go to [email or web address].    Your next appointment:    AS NEEDED

## 2020-07-02 ENCOUNTER — Other Ambulatory Visit: Payer: Self-pay | Admitting: Family Medicine

## 2020-07-08 ENCOUNTER — Ambulatory Visit: Payer: 59 | Admitting: Family Medicine

## 2020-07-08 ENCOUNTER — Encounter: Payer: Self-pay | Admitting: Family Medicine

## 2020-09-28 ENCOUNTER — Other Ambulatory Visit: Payer: Self-pay | Admitting: Family Medicine

## 2021-01-04 ENCOUNTER — Other Ambulatory Visit: Payer: Self-pay | Admitting: Family Medicine

## 2021-04-01 NOTE — Telephone Encounter (Signed)
Patient has new provider not needing refills

## 2021-09-29 ENCOUNTER — Encounter: Payer: Self-pay | Admitting: Emergency Medicine

## 2021-09-29 ENCOUNTER — Other Ambulatory Visit: Payer: Self-pay

## 2021-09-29 ENCOUNTER — Ambulatory Visit
Admission: EM | Admit: 2021-09-29 | Discharge: 2021-09-29 | Disposition: A | Discharge status: Home / Self Care | Payer: 59 | Attending: Nurse Practitioner | Admitting: Nurse Practitioner

## 2021-09-29 DIAGNOSIS — T7840XA Allergy, unspecified, initial encounter: Secondary | ICD-10-CM

## 2021-09-29 MED ORDER — DEXAMETHASONE SODIUM PHOSPHATE 10 MG/ML IJ SOLN
10.0000 mg | INTRAMUSCULAR | Status: AC
  Administered 2021-09-29: 10 mg via INTRAMUSCULAR

## 2021-09-29 MED ORDER — EPINEPHRINE 0.3 MG/0.3ML IJ SOAJ: 0.3000 mg | INTRAMUSCULAR | 0 refills | Status: DC | PRN

## 2021-09-29 MED ORDER — PREDNISONE 50 MG PO TABS: ORAL_TABLET | ORAL | 0 refills | Status: DC

## 2021-09-29 NOTE — ED Triage Notes (Signed)
Pt reports was stung by several yellow jackets yesterday. Pt reports had epi pen but reports expired.   Pt denies difficulty swallowing or breathing at this time. Moderate swelling noted to left eye, right hand. Pt reports has been taking benadryl otc and reports continued itching.

## 2021-09-29 NOTE — Discharge Instructions (Addendum)
Take medication as prescribed. Continue taking Benadryl 1 to 2 tablets every 6 hours while symptoms persist.  As discussed, you can take over-the-counter Zyrtec Continue to monitor for worsening of symptoms. If you develop tongue swelling, lip swelling, feel like your throat is coming close, shortness of breath, difficulty breathing, or other concerns, please go to the emergency department immediately. Follow-up as needed.

## 2021-09-29 NOTE — ED Provider Notes (Signed)
[facility]    CSN: [id number] Arrival date & time: 09/29/21  0805      History   Chief Complaint Chief Complaint  Patient presents with   Insect Bite    HPI Gary Harrison is a 66 y.o. male.   The history is provided by the patient.   Patient presents for complaints of swelling and itching after being stung by several yellow jackets yesterday around 1 PM.  He states that he does not know how many yellowjacket stung him.  He complains of swelling to the right and left hands, left thigh, into an area on his back.  He denies chest pain, shortness of breath, difficulty breathing, tongue swelling, throat swelling, or chest tightness.  He states that he has been taking Benadryl over-the-counter every 6 hours.  States that he does have an EpiPen, but when he went to the pharmacy to get instructions on how to use it, pharmacist informed him that it was expired.  States he was given an EpiPen several years ago for bee and wasp stings.  Past Medical History:  Diagnosis Date   ED (erectile dysfunction)    H pylori ulcer    2014, multiple gastric and duodenal ulcers due to H. pylori.  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  The polyps ranged from 8 mm  to 1.2 cm.  One pedunculated sigmoid colon polyp removed via snare cautery; others via cold forceps. One 3-mm cecal polyp removed via cold forceps.  Otherwise, no masses, inflammatory changes, diverticular, or arteriovenous malformations seen. simple adenomas. Needs surveillance 2014   COLONOSCOPY WITH ESOPHAGOGASTRODUODENOSCOPY (EGD) N/A 01/25/2013   Dr. Oneida Alar: Multiple small ulcers seen in the gastric antrum as well as the duodenum.  Biopsies showed H. pylori gastritis.  Need to come back for 54-monthfollow-up EGD but he did not have this done.  Colonoscopy showed small internal hemorrhoids, 8 colon polyps removed only one was a simple adenoma.  Next colonoscopy in 10 years.       Home Medications    Prior to Admission medications   Medication Sig Start Date End Date Taking? Authorizing Provider  EPINEPHrine 0.3 mg/0.3 mL IJ SOAJ injection Inject 0.3 mg into the muscle as needed for anaphylaxis. 09/29/21  Yes Heather Streeper-Warren, CAlda Lea NP  predniSONE (DELTASONE) 50 MG tablet Take 1 tablet daily with breakfast for 5 days. 09/29/21  Yes Sael Furches-Warren, CAlda Lea NP  amLODipine (NORVASC) 10 MG tablet Take 1 tablet (10 mg total) by mouth daily. 03/30/20   CLelon Perla MD  Ascorbic Acid (VITAMIN C) 100 MG tablet Take  100 mg by mouth daily.    [provider]  cyanocobalamin 100 MCG tablet Take 100 mcg by mouth every other day.    [provider]  fluticasone (FLONASE) 50 MCG/ACT nasal spray SHAKE LIQUID AND USE 2 SPRAYS IN EACH NOSTRIL EVERY DAY 09/28/20   Elvia Collum M, DO  Multiple Vitamins-Minerals (ONE-A-DAY 50 PLUS PO) Take by mouth every other day.    [provider]  tadalafil (CIALIS) 20 MG tablet Take 1 tablet (20 mg total) by mouth daily as needed for erectile dysfunction. 12/27/18   Mikey Kirschner, MD   Turmeric 500 MG CAPS Take by mouth once.    [provider]    Family History Family History  Problem Relation Age of Onset   Cancer Mother    Cancer Father    Colon cancer Neg Hx     Social History Social History   Tobacco Use   Smoking status: Every Day    Packs/day: 0.50    Types: Cigarettes   Smokeless tobacco: Never  Substance Use Topics   Alcohol use: Yes    Comment: Consumes a mix drinking at least 3 beers every day.,  Remote DUI in the 13s.   Drug use: No     Allergies   Iodine   Review of Systems Review of Systems Per HPI  Physical Exam Triage Vital Signs ED Triage Vitals  Enc Vitals Group     BP 09/29/21 0822 (!) 195/94     Pulse Rate 09/29/21 0822 (!) 59     Resp 09/29/21 0822 20     Temp 09/29/21 0822 98.8 F (37.1 C)     Temp Source 09/29/21 0822 Oral     SpO2 09/29/21 0822 97 %     Weight --      Height --      Head Circumference --      Peak Flow --      Pain Score 09/29/21 0823 2     Pain Loc --      Pain Edu? --      Excl. in [place]? --    No data found.  Updated Vital Signs BP (!) 195/94 (BP Location: Right Arm)   Pulse (!) 59   Temp 98.8 F (37.1 C) (Oral)   Resp 20   SpO2 97%   Visual Acuity Right Eye Distance:   Left Eye Distance:   Bilateral Distance:    Right Eye Near:   Left Eye Near:    Bilateral Near:     Physical Exam Vitals and nursing note reviewed.  Constitutional:      General: He is not in acute distress.    Appearance: Normal appearance.  HENT:     Head: Normocephalic.     Right Ear: Tympanic membrane, ear canal and external ear normal.     Left Ear: Tympanic membrane, ear canal and external ear normal.     Nose: Nose normal.     Mouth/Throat:     Mouth: Mucous membranes are moist.     Comments: Airway is patent. Eyes:     General: Vision grossly intact.        Left eye: No foreign body, discharge or hordeolum.     Extraocular Movements: Extraocular movements intact.     Left eye: Normal  extraocular motion and no nystagmus.     Conjunctiva/sclera: Conjunctivae normal.     Left eye: Left conjunctiva is not injected. No chemosis, exudate or hemorrhage.  Pupils: Pupils are equal, round, and reactive to light.     Comments: Swelling noted to the upper and lower eyelids of the left eye.  Cardiovascular:     Rate and Rhythm: Normal rate and regular rhythm.  Pulmonary:     Effort: Pulmonary effort is normal. No respiratory distress.     Breath sounds: Normal breath sounds. No stridor. No wheezing, rhonchi or rales.  Abdominal:     General: Bowel sounds are normal.     Palpations: Abdomen is soft.     Tenderness: There is no guarding.  Musculoskeletal:     Cervical back: Normal range of motion.  Lymphadenopathy:     Cervical: No cervical adenopathy.  Skin:    General: Skin is warm and dry.          Comments: Erythematous induration to the left lower back.  There is no oozing, fluctuance, or drainage present.  Neurological:     General: No focal deficit present.     Mental Status: He is alert and oriented to person, place, and time.  Psychiatric:        Mood and Affect: Mood normal.        Behavior: Behavior normal.      UC Treatments / Results  Labs (all labs ordered are listed, but only abnormal results are displayed) Labs Reviewed - No data to display  EKG   Radiology No results found.  Procedures Procedures (including critical care time)  Medications Ordered in UC Medications  dexamethasone (DECADRON) injection 10 mg (has no administration in time range)    Initial Impression / Assessment and Plan / UC Course  I have reviewed the triage vital signs and the nursing notes.  Pertinent labs & imaging results that were available during my care of the patient were reviewed by me and considered in my medical decision making (see chart for details).  Patient presents after he was stung by several wasps over the past 24 hours.  On exam, he has localized  swelling to his bilateral hands, and around his left eye.  There is an area of redness to his left lower back.  Patient is well-appearing, his lung sounds are clear, airway is patent, he is in no acute distress.  There is no indication that patient experience anaphylaxis.  Decadron 10 mg IM injection given in the clinic.  Will start patient on prednisone 50 mg for 5 days.  Patient was advised to continue use of Benadryl every 6 hours until his symptoms improve.  Supportive care recommendations were provided to the patient along with strict indications of when to go to the emergency department.  Patient verbalizes understanding.  All questions were answered. Final Clinical Impressions(s) / UC Diagnoses   Final diagnoses:  Allergic reaction, initial encounter     Discharge Instructions      Take medication as prescribed. Continue taking Benadryl 1 to 2 tablets every 6 hours while symptoms persist.  As discussed, you can take over-the-counter Zyrtec Continue to monitor for worsening of symptoms. If you develop tongue swelling, lip swelling, feel like your throat is coming close, shortness of breath, difficulty breathing, or other concerns, please go to the emergency department immediately. Follow-up as needed.      ED Prescriptions     Medication Sig Dispense Auth. Provider   predniSONE (DELTASONE) 50 MG tablet Take 1 tablet daily with breakfast for 5 days. 5 tablet Alexio Sroka-Warren, Alda Lea, NP   EPINEPHrine 0.3 mg/0.3 mL IJ SOAJ injection Inject  0.3 mg into the muscle as needed for anaphylaxis. 1 each Marva Hendryx-Warren, Alda Lea, NP      PDMP not reviewed this encounter.   Tish Men, NP 09/29/21 [phone]

## 2022-12-21 ENCOUNTER — Encounter: Payer: Self-pay | Admitting: *Deleted

## 2022-12-29 DIAGNOSIS — I10 Essential (primary) hypertension: Secondary | ICD-10-CM | POA: Diagnosis not present

## 2022-12-29 DIAGNOSIS — Z125 Encounter for screening for malignant neoplasm of prostate: Secondary | ICD-10-CM | POA: Diagnosis not present

## 2022-12-29 DIAGNOSIS — R5383 Other fatigue: Secondary | ICD-10-CM | POA: Diagnosis not present

## 2022-12-29 DIAGNOSIS — D751 Secondary polycythemia: Secondary | ICD-10-CM | POA: Diagnosis not present

## 2022-12-29 DIAGNOSIS — D7589 Other specified diseases of blood and blood-forming organs: Secondary | ICD-10-CM | POA: Diagnosis not present

## 2023-01-05 DIAGNOSIS — Z23 Encounter for immunization: Secondary | ICD-10-CM | POA: Diagnosis not present

## 2023-01-05 DIAGNOSIS — D7589 Other specified diseases of blood and blood-forming organs: Secondary | ICD-10-CM | POA: Diagnosis not present

## 2023-01-05 DIAGNOSIS — I10 Essential (primary) hypertension: Secondary | ICD-10-CM | POA: Diagnosis not present

## 2023-01-05 DIAGNOSIS — R5383 Other fatigue: Secondary | ICD-10-CM | POA: Diagnosis not present

## 2023-01-05 DIAGNOSIS — Z0000 Encounter for general adult medical examination without abnormal findings: Secondary | ICD-10-CM | POA: Diagnosis not present

## 2023-01-05 DIAGNOSIS — D751 Secondary polycythemia: Secondary | ICD-10-CM | POA: Diagnosis not present

## 2023-03-14 ENCOUNTER — Telehealth: Payer: Self-pay | Admitting: *Deleted

## 2023-03-14 NOTE — Telephone Encounter (Signed)
  Procedure: Colonoscopy  Estimated body mass index is 24.94 kg/m as calculated from the following:   Height as of 03/30/20: 5' 11 (1.803 m).   Weight as of 03/30/20: 178 lb 12.8 oz (81.1 kg).   Have you had a colonoscopy before?  01/2013, Dr. Harvey  Do you have family history of colon cancer?  no  Do you have a family history of polyps? no  Previous colonoscopy with polyps removed? yes  Do you have a history colorectal cancer?   no  Are you diabetic?  no  Do you have a prosthetic or mechanical heart valve? no  Do you have a pacemaker/defibrillator?   no  Have you had endocarditis/atrial fibrillation?  no  Do you use supplemental oxygen/CPAP?  no  Have you had joint replacement within the last 12 months?  no  Do you tend to be constipated or have to use laxatives?  no   Do you have history of alcohol use? If yes, how much and how often.  no  Do you have history or are you using drugs? If yes, what do are you  using?  no  Have you ever had a stroke/heart attack?  no  Have you ever had a heart or other vascular stent placed,?no  Do you take weight loss medication? no  Do you take any blood-thinning medications such as: (Plavix, aspirin , Coumadin, Aggrenox, Brilinta, Xarelto, Eliquis, Pradaxa, Savaysa or Effient)? no  If yes we need the name, milligram, dosage and who is prescribing doctor:               Current Outpatient Medications  Medication Sig Dispense Refill   amLODipine  (NORVASC ) 10 MG tablet Take 1 tablet (10 mg total) by mouth daily. 90 tablet 3   fluticasone  (FLONASE ) 50 MCG/ACT nasal spray SHAKE LIQUID AND USE 2 SPRAYS IN EACH NOSTRIL EVERY DAY 16 g 2   olmesartan (BENICAR) 20 MG tablet Take 20 mg by mouth daily.     No current facility-administered medications for this visit.    Allergies  Allergen Reactions   Iodine

## 2023-04-05 NOTE — Telephone Encounter (Signed)
Asa 2 appropriate.

## 2023-04-06 MED ORDER — PEG 3350-KCL-NA BICARB-NACL 420 G PO SOLR: 4000.0000 mL | Freq: Once | ORAL | 0 refills | Status: AC

## 2023-04-06 NOTE — Telephone Encounter (Signed)
Questionnaire from recall, no referral needed

## 2023-04-06 NOTE — Telephone Encounter (Signed)
Spoke with pt. He has been scheduled for 2/25, aware will send instructions and rx for prep to pharmacy.

## 2023-04-06 NOTE — Addendum Note (Signed)
Addended by: Armstead Peaks on: 04/06/2023 09:16 AM   Modules accepted: Orders

## 2023-05-02 ENCOUNTER — Other Ambulatory Visit: Payer: Self-pay

## 2023-05-02 ENCOUNTER — Encounter (HOSPITAL_COMMUNITY)
Admission: RE | Disposition: A | Discharge status: Home / Self Care | Payer: Self-pay | Source: Home / Self Care | Attending: Internal Medicine

## 2023-05-02 ENCOUNTER — Ambulatory Visit (HOSPITAL_COMMUNITY): Payer: HMO | Admitting: Anesthesiology

## 2023-05-02 ENCOUNTER — Encounter (HOSPITAL_COMMUNITY): Payer: Self-pay | Admitting: Internal Medicine

## 2023-05-02 ENCOUNTER — Ambulatory Visit (HOSPITAL_COMMUNITY)
Admission: RE | Admit: 2023-05-02 | Discharge: 2023-05-02 | Disposition: A | Discharge status: Home / Self Care | Payer: HMO | Attending: Internal Medicine | Admitting: Internal Medicine

## 2023-05-02 DIAGNOSIS — F17210 Nicotine dependence, cigarettes, uncomplicated: Secondary | ICD-10-CM | POA: Diagnosis not present

## 2023-05-02 DIAGNOSIS — K635 Polyp of colon: Secondary | ICD-10-CM | POA: Insufficient documentation

## 2023-05-02 DIAGNOSIS — D124 Benign neoplasm of descending colon: Secondary | ICD-10-CM

## 2023-05-02 DIAGNOSIS — Z1211 Encounter for screening for malignant neoplasm of colon: Secondary | ICD-10-CM | POA: Diagnosis not present

## 2023-05-02 DIAGNOSIS — K648 Other hemorrhoids: Secondary | ICD-10-CM | POA: Diagnosis not present

## 2023-05-02 DIAGNOSIS — I10 Essential (primary) hypertension: Secondary | ICD-10-CM | POA: Insufficient documentation

## 2023-05-02 DIAGNOSIS — D123 Benign neoplasm of transverse colon: Secondary | ICD-10-CM | POA: Diagnosis not present

## 2023-05-02 DIAGNOSIS — D120 Benign neoplasm of cecum: Secondary | ICD-10-CM | POA: Insufficient documentation

## 2023-05-02 DIAGNOSIS — R7301 Impaired fasting glucose: Secondary | ICD-10-CM | POA: Insufficient documentation

## 2023-05-02 DIAGNOSIS — Z860101 Personal history of adenomatous and serrated colon polyps: Secondary | ICD-10-CM | POA: Diagnosis present

## 2023-05-02 DIAGNOSIS — Z139 Encounter for screening, unspecified: Secondary | ICD-10-CM | POA: Diagnosis not present

## 2023-05-02 DIAGNOSIS — G709 Myoneural disorder, unspecified: Secondary | ICD-10-CM | POA: Diagnosis not present

## 2023-05-02 HISTORY — PX: POLYPECTOMY: SHX5525

## 2023-05-02 HISTORY — PX: COLONOSCOPY WITH PROPOFOL: SHX5780

## 2023-05-02 SURGERY — COLONOSCOPY WITH PROPOFOL
Anesthesia: General

## 2023-05-02 MED ORDER — LACTATED RINGERS IV SOLN: INTRAVENOUS | Status: DC | PRN

## 2023-05-02 MED ORDER — PROPOFOL 10 MG/ML IV BOLUS
INTRAVENOUS | Status: DC | PRN
  Administered 2023-05-02: 50 mg via INTRAVENOUS
  Administered 2023-05-02: 100 mg via INTRAVENOUS

## 2023-05-02 MED ORDER — LIDOCAINE HCL (CARDIAC) PF 100 MG/5ML IV SOSY
PREFILLED_SYRINGE | INTRAVENOUS | Status: DC | PRN
  Administered 2023-05-02: 50 mg via INTRATRACHEAL

## 2023-05-02 MED ORDER — SODIUM CHLORIDE 0.9% FLUSH: 3.0000 mL | INTRAVENOUS | Status: DC | PRN

## 2023-05-02 MED ORDER — SODIUM CHLORIDE 0.9% FLUSH: 3.0000 mL | Freq: Two times a day (BID) | INTRAVENOUS | Status: DC

## 2023-05-02 MED ORDER — PROPOFOL 500 MG/50ML IV EMUL
INTRAVENOUS | Status: DC | PRN
  Administered 2023-05-02: 150 ug/kg/min via INTRAVENOUS

## 2023-05-02 NOTE — Op Note (Signed)
 [facility] Patient Name: Gary Harrison Procedure Date: 05/02/2023 10:12 AM MRN: [id number] Date of Birth: [birth year] Attending MD: Hennie Duos. Marletta Lor , [place], [id number] CSN: [id number] Age: 68 Admit Type: Outpatient Procedure:                Colonoscopy Indications:              Surveillance: Personal history of adenomatous                            polyps on last colonoscopy > 5 years ago Providers:                Hennie Duos. Marletta Lor, DO, Angelica Ran, Kristine L.                            Jessee Avers, Technician Referring MD:              Medicines:                See the Anesthesia note for documentation of the                            administered medications Complications:            No immediate complications. Estimated Blood Loss:     Estimated blood loss was minimal. Procedure:                Pre-Anesthesia Assessment:                           - The anesthesia plan was to use monitored                            anesthesia care (MAC).                           After obtaining informed consent, the colonoscope                            was passed under direct vision. Throughout the                            procedure, the patient's blood pressure, pulse, and                            oxygen saturations were monitored continuously. The                            PCF-HQ190L ([id number]) scope was introduced through                            the anus and advanced to the the cecum, identified                            by appendiceal orifice and ileocecal valve. The                            colonoscopy was  performed without difficulty. The                            patient tolerated the procedure well. The quality                            of the bowel preparation was evaluated using the                            BBPS [facility] Bowel Preparation Scale) with scores                            of: Right Colon = 3, Transverse Colon = 3 and Left                            Colon  = 3 (entire mucosa seen well with no residual                            staining, small fragments of stool or opaque                            liquid). The total BBPS score equals 9. Scope In: 10:31:16 AM Scope Out: 10:55:18 AM Scope Withdrawal Time: 0 hours 20 minutes 48 seconds  Total Procedure Duration: 0 hours 24 minutes 2 seconds  Findings:      Non-bleeding internal hemorrhoids were found.      A 4 mm polyp was found in the cecum. The polyp was sessile. The polyp       was removed with a cold snare. Resection and retrieval were complete.      12 sessile polyps were found in the descending colon and transverse       colon. The polyps were 5 to 8 mm in size. These polyps were removed with       a cold snare. Resection and retrieval were complete.      Three sessile polyps were found in the recto-sigmoid colon and sigmoid       colon. The polyps were 4 to 7 mm in size. These polyps were removed with       a cold snare. Resection and retrieval were complete. Impression:               - Non-bleeding internal hemorrhoids.                           - One 4 mm polyp in the cecum, removed with a cold                            snare. Resected and retrieved.                           - 12 5 to 8 mm polyps in the descending colon and                            in the transverse colon, removed with a cold snare.  Resected and retrieved.                           - Three 4 to 7 mm polyps at the recto-sigmoid colon                            and in the sigmoid colon, removed with a cold                            snare. Resected and retrieved. Moderate Sedation:      Per Anesthesia Care Recommendation:           - Patient has a contact number available for                            emergencies. The signs and symptoms of potential                            delayed complications were discussed with the                            patient. Return to normal activities  tomorrow.                            Written discharge instructions were provided to the                            patient.                           - Resume previous diet.                           - Continue present medications.                           - Await pathology results.                           - Repeat colonoscopy in 1 year for surveillance.                           - Return to GI clinic PRN. Procedure Code(s):        --- Professional ---                           [phone], Colonoscopy, flexible; with removal of                            tumor(s), polyp(s), or other lesion(s) by snare                            technique Diagnosis Code(s):        --- Professional ---                           Z86.010,  Personal history of colonic polyps                           D12.0, Benign neoplasm of cecum                           D12.4, Benign neoplasm of descending colon                           D12.3, Benign neoplasm of transverse colon (hepatic                            flexure or splenic flexure)                           D12.7, Benign neoplasm of rectosigmoid junction                           D12.5, Benign neoplasm of sigmoid colon                           K64.8, Other hemorrhoids CPT copyright 2022 American Medical Association. All rights reserved. The codes documented in this report are preliminary and upon coder review may  be revised to meet current compliance requirements. Hennie Duos. Marletta Lor, DO Hennie Duos. Amy Belloso, DO 05/02/2023 11:01:01 AM This report has been signed electronically. Number of Addenda: 0

## 2023-05-02 NOTE — H&P (Signed)
 Primary Care Physician:  Georgianne Fick, MD Primary Gastroenterologist:  Dr. Marletta Lor  Pre-Procedure History & Physical: HPI:  Gary Harrison is a 68 y.o. male is here for a colonoscopy to be performed for surveillance purposes, personal history of adenomatous colon polyps in 2014  Past Medical History:  Diagnosis Date   ED (erectile dysfunction)    H pylori ulcer    2014, multiple gastric and duodenal ulcers due to H. pylori.  Treated with amoxicillin, Biaxin, PPI   Hypertension    no meds currently   Impaired fasting glucose     Past Surgical History:  Procedure Laterality Date   COLONOSCOPY  10/18/2007     WUJ:WJXBJ descending colon polyps.  The polyps ranged from 8 mm  to 1.2 cm.  One pedunculated sigmoid colon polyp removed via snare cautery; others via cold forceps. One 3-mm cecal polyp removed via cold forceps.  Otherwise, no masses, inflammatory changes, diverticular, or arteriovenous malformations seen. simple adenomas. Needs surveillance 2014   COLONOSCOPY WITH ESOPHAGOGASTRODUODENOSCOPY (EGD) N/A 01/25/2013   Dr. Darrick Penna: Multiple small ulcers seen in the gastric antrum as well as the duodenum.  Biopsies showed H. pylori gastritis.  Need to come back for 22-month follow-up EGD but he did not have this done.  Colonoscopy showed small internal hemorrhoids, 8 colon polyps removed only one was a simple adenoma.  Next colonoscopy in 10 years.    Prior to Admission medications   Medication Sig Start Date End Date Taking? Authorizing Provider  amLODipine (NORVASC) 10 MG tablet Take 1 tablet (10 mg total) by mouth daily. 03/30/20  Yes Lewayne Bunting, MD  fluticasone (FLONASE) 50 MCG/ACT nasal spray SHAKE LIQUID AND USE 2 SPRAYS IN EACH NOSTRIL EVERY DAY 09/28/20  Yes Ladona Ridgel, Malena M, DO  olmesartan (BENICAR) 20 MG tablet Take 20 mg by mouth daily.   Yes [provider]    Allergies as of 04/06/2023 - Review Complete 03/14/2023  Allergen Reaction Noted   Iodine   01/11/2013    Family History  Problem Relation Age of Onset   Cancer Mother    Cancer Father    Colon cancer Neg Hx     Social History   Socioeconomic History   Marital status: Married    Spouse name: Not on file   Number of children: 4   Years of education: Not on file   Highest education level: Not on file  Occupational History    Comment: Retired  Tobacco Use   Smoking status: Every Day    Current packs/day: 0.50    Types: Cigarettes   Smokeless tobacco: Never  Vaping Use   Vaping status: Not on file  Substance and Sexual Activity   Alcohol use: Yes    Comment: Consumes a mix drinking at least 3 beers every day.,  Remote DUI in the 80s.   Drug use: No   Sexual activity: Not on file  Other Topics Concern   Not on file  Social History Narrative   Not on file   Social Drivers of Health   Financial Resource Strain: Not on file  Food Insecurity: Not on file  Transportation Needs: Not on file  Physical Activity: Not on file  Stress: Not on file  Social Connections: Not on file  Intimate Partner Violence: Not on file    Review of Systems: See HPI, otherwise negative ROS  Physical Exam: Vital signs in last 24 hours:     General:   Alert,  Well-developed, well-nourished, pleasant  and cooperative in NAD Head:  Normocephalic and atraumatic. Eyes:  Sclera clear, no icterus.   Conjunctiva pink. Ears:  Normal auditory acuity. Nose:  No deformity, discharge,  or lesions. Msk:  Symmetrical without gross deformities. Normal posture. Extremities:  Without clubbing or edema. Neurologic:  Alert and  oriented x4;  grossly normal neurologically. Skin:  Intact without significant lesions or rashes. Psych:  Alert and cooperative. Normal mood and affect.  Impression/Plan: Gary Harrison is here for a colonoscopy to be performed for surveillance purposes, personal history of adenomatous colon polyps in 2014  The risks of the procedure including infection, bleed, or  perforation as well as benefits, limitations, alternatives and imponderables have been reviewed with the patient. Questions have been answered. All parties agreeable.

## 2023-05-02 NOTE — Anesthesia Preprocedure Evaluation (Addendum)
 Anesthesia Evaluation  Patient identified by MRN, date of birth, ID band Patient awake    Reviewed: Allergy & Precautions, H&P , NPO status , Patient's Chart, lab work & pertinent test results, reviewed documented beta blocker date and time   Airway Mallampati: II  TM Distance: >3 FB Neck ROM: full    Dental no notable dental hx. (+) Dental Advisory Given, Teeth Intact   Pulmonary Current Smoker and Patient abstained from smoking.   Pulmonary exam normal breath sounds clear to auscultation       Cardiovascular Exercise Tolerance: Good hypertension, Normal cardiovascular exam Rhythm:regular Rate:Normal     Neuro/Psych  Neuromuscular disease  negative psych ROS   GI/Hepatic Neg liver ROS,,,  Endo/Other  Impaired fasting glucose  Renal/GU negative Renal ROS  negative genitourinary   Musculoskeletal   Abdominal   Peds  Hematology negative hematology ROS (+)   Anesthesia Other Findings   Reproductive/Obstetrics negative OB ROS                             Anesthesia Physical Anesthesia Plan  ASA: 2  Anesthesia Plan: General   Post-op Pain Management: Minimal or no pain anticipated   Induction: Intravenous  PONV Risk Score and Plan: Propofol infusion  Airway Management Planned: Natural Airway and Nasal Cannula  Additional Equipment: None  Intra-op Plan:   Post-operative Plan:   Informed Consent: I have reviewed the patients History and Physical, chart, labs and discussed the procedure including the risks, benefits and alternatives for the proposed anesthesia with the patient or authorized representative who has indicated his/her understanding and acceptance.     Dental advisory given  Plan Discussed with: CRNA  Anesthesia Plan Comments:        Anesthesia Quick Evaluation

## 2023-05-02 NOTE — Anesthesia Postprocedure Evaluation (Signed)
 Anesthesia Post Note  Patient: Gary Harrison  Procedure(s) Performed: COLONOSCOPY WITH PROPOFOL POLYPECTOMY  Patient location during evaluation: Endoscopy Anesthesia Type: General Level of consciousness: awake Pain management: pain level controlled Vital Signs Assessment: post-procedure vital signs reviewed and stable Respiratory status: nonlabored ventilation Cardiovascular status: blood pressure returned to baseline and stable Postop Assessment: no apparent nausea or vomiting Anesthetic complications: no   No notable events documented.   Last Vitals:  Vitals:   05/02/23 0953  BP: (!) 171/86  Pulse: (!) 59  Resp: 13  Temp: 37.1 C  SpO2: 97%    Last Pain:  Vitals:   05/02/23 1025  TempSrc:   PainSc: 0-No pain                 Amijah Timothy

## 2023-05-02 NOTE — Discharge Instructions (Addendum)
  Colonoscopy Discharge Instructions  Read the instructions outlined below and refer to this sheet in the next few weeks. These discharge instructions provide you with general information on caring for yourself after you leave the hospital. Your doctor may also give you specific instructions. While your treatment has been planned according to the most current medical practices available, unavoidable complications occasionally occur.   ACTIVITY You may resume your regular activity, but move at a slower pace for the next 24 hours.  Take frequent rest periods for the next 24 hours.  Walking will help get rid of the air and reduce the bloated feeling in your belly (abdomen).  No driving for 24 hours (because of the medicine (anesthesia) used during the test).   Do not sign any important legal documents or operate any machinery for 24 hours (because of the anesthesia used during the test).  NUTRITION Drink plenty of fluids.  You may resume your normal diet as instructed by your doctor.  Begin with a light meal and progress to your normal diet. Heavy or fried foods are harder to digest and may make you feel sick to your stomach (nauseated).  Avoid alcoholic beverages for 24 hours or as instructed.  MEDICATIONS You may resume your normal medications unless your doctor tells you otherwise.  WHAT YOU CAN EXPECT TODAY Some feelings of bloating in the abdomen.  Passage of more gas than usual.  Spotting of blood in your stool or on the toilet paper.  IF YOU HAD POLYPS REMOVED DURING THE COLONOSCOPY: No aspirin products for 7 days or as instructed.  No alcohol for 7 days or as instructed.  Eat a soft diet for the next 24 hours.  FINDING OUT THE RESULTS OF YOUR TEST Not all test results are available during your visit. If your test results are not back during the visit, make an appointment with your caregiver to find out the results. Do not assume everything is normal if you have not heard from your  caregiver or the medical facility. It is important for you to follow up on all of your test results.  SEEK IMMEDIATE MEDICAL ATTENTION IF: You have more than a spotting of blood in your stool.  Your belly is swollen (abdominal distention).  You are nauseated or vomiting.  You have a temperature over 101.  You have abdominal pain or discomfort that is severe or gets worse throughout the day.   Your colonoscopy revealed 16 polyp(s) which I removed successfully. Await pathology results, my office will contact you. I recommend repeating colonoscopy in 1 year for surveillance purposes. Otherwise follow up with GI as needed.   I hope you have a great rest of your week!  Gary Harrison. Marletta Lor, D.O. Gastroenterology and Hepatology [facility]

## 2023-05-02 NOTE — Transfer of Care (Signed)
 Immediate Anesthesia Transfer of Care Note  Patient: Gary Harrison  Procedure(s) Performed: COLONOSCOPY WITH PROPOFOL POLYPECTOMY  Patient Location: Endoscopy Unit  Anesthesia Type:General  Level of Consciousness: awake  Airway & Oxygen Therapy: Patient Spontanous Breathing  Post-op Assessment: Report given to RN  Post vital signs: Reviewed and stable  Last Vitals:  Vitals Value Taken Time  BP    Temp    Pulse    Resp    SpO2      Last Pain:  Vitals:   05/02/23 1025  TempSrc:   PainSc: 0-No pain      Patients Stated Pain Goal: 9 (05/02/23 0953)  Complications: No notable events documented.

## 2023-05-03 LAB — SURGICAL PATHOLOGY

## 2023-05-04 ENCOUNTER — Encounter (HOSPITAL_COMMUNITY): Payer: Self-pay | Admitting: Internal Medicine

## 2023-07-27 DIAGNOSIS — I10 Essential (primary) hypertension: Secondary | ICD-10-CM | POA: Diagnosis not present

## 2023-07-27 DIAGNOSIS — D751 Secondary polycythemia: Secondary | ICD-10-CM | POA: Diagnosis not present

## 2023-07-27 DIAGNOSIS — D7589 Other specified diseases of blood and blood-forming organs: Secondary | ICD-10-CM | POA: Diagnosis not present

## 2023-08-03 ENCOUNTER — Other Ambulatory Visit: Payer: Self-pay | Admitting: Internal Medicine

## 2023-08-03 DIAGNOSIS — D7589 Other specified diseases of blood and blood-forming organs: Secondary | ICD-10-CM | POA: Diagnosis not present

## 2023-08-03 DIAGNOSIS — R5383 Other fatigue: Secondary | ICD-10-CM | POA: Diagnosis not present

## 2023-08-03 DIAGNOSIS — I10 Essential (primary) hypertension: Secondary | ICD-10-CM | POA: Diagnosis not present

## 2023-08-03 DIAGNOSIS — F1010 Alcohol abuse, uncomplicated: Secondary | ICD-10-CM | POA: Diagnosis not present

## 2023-08-03 DIAGNOSIS — M79604 Pain in right leg: Secondary | ICD-10-CM

## 2023-08-03 DIAGNOSIS — E782 Mixed hyperlipidemia: Secondary | ICD-10-CM | POA: Diagnosis not present

## 2023-08-07 ENCOUNTER — Ambulatory Visit
Admission: RE | Admit: 2023-08-07 | Discharge: 2023-08-07 | Disposition: A | Discharge status: Home / Self Care | Source: Ambulatory Visit | Attending: Internal Medicine | Admitting: Internal Medicine

## 2023-08-07 DIAGNOSIS — I739 Peripheral vascular disease, unspecified: Secondary | ICD-10-CM | POA: Diagnosis not present

## 2023-08-07 DIAGNOSIS — M79604 Pain in right leg: Secondary | ICD-10-CM

## 2023-08-07 DIAGNOSIS — R9439 Abnormal result of other cardiovascular function study: Secondary | ICD-10-CM | POA: Diagnosis not present

## 2023-08-17 DIAGNOSIS — I739 Peripheral vascular disease, unspecified: Secondary | ICD-10-CM | POA: Diagnosis not present

## 2023-08-17 DIAGNOSIS — M79604 Pain in right leg: Secondary | ICD-10-CM | POA: Diagnosis not present

## 2023-08-17 DIAGNOSIS — I10 Essential (primary) hypertension: Secondary | ICD-10-CM | POA: Diagnosis not present

## 2023-08-17 DIAGNOSIS — R002 Palpitations: Secondary | ICD-10-CM | POA: Diagnosis not present

## 2023-08-17 DIAGNOSIS — D7589 Other specified diseases of blood and blood-forming organs: Secondary | ICD-10-CM | POA: Diagnosis not present

## 2023-08-17 DIAGNOSIS — E782 Mixed hyperlipidemia: Secondary | ICD-10-CM | POA: Diagnosis not present

## 2023-09-12 ENCOUNTER — Ambulatory Visit: Attending: Vascular Surgery | Admitting: Vascular Surgery

## 2023-09-12 ENCOUNTER — Encounter: Payer: Self-pay | Admitting: Vascular Surgery

## 2023-09-12 VITALS — BP 158/86 | HR 67 | Temp 98.1°F | Resp 18 | Ht 71.0 in | Wt 174.2 lb

## 2023-09-12 DIAGNOSIS — I70213 Atherosclerosis of native arteries of extremities with intermittent claudication, bilateral legs: Secondary | ICD-10-CM | POA: Diagnosis not present

## 2023-09-12 DIAGNOSIS — I70219 Atherosclerosis of native arteries of extremities with intermittent claudication, unspecified extremity: Secondary | ICD-10-CM | POA: Insufficient documentation

## 2023-09-12 MED ORDER — CILOSTAZOL 100 MG PO TABS: 100.0000 mg | ORAL_TABLET | Freq: Two times a day (BID) | ORAL | 11 refills | Status: DC

## 2023-09-12 NOTE — Progress Notes (Signed)
 Patient name: Gary Harrison MRN: [id number] DOB: 12-27-1955 Sex: male  REASON FOR CONSULT: Leg blockage  HPI: Gary Harrison is a 68 y.o. male, with history of hypertension and tobacco abuse that presents for evaluation of leg blockages.  About 3 months ago he started getting cramping in right calf after walking 1/4 mile.  States he is having the same in his left leg.  Has a hard time walking his dog long distances.  Had to stop mowing yards except for the riding mower and was moving about 5 yards at a time.  Patient had arterial studies on 08/07/2023 that were 0.66 on the right and 0.61 on the left.  Past Medical History:  Diagnosis Date   ED (erectile dysfunction)    H pylori ulcer    2014, multiple gastric and duodenal ulcers due to H. pylori.  Treated with amoxicillin , Biaxin , PPI   Hypertension    no meds currently   Impaired fasting glucose    Peripheral vascular disease [facility])     Past Surgical History:  Procedure Laterality Date   COLONOSCOPY  10/18/2007     DOQ:Uymzz descending colon polyps.  The polyps ranged from 8 mm  to 1.2 cm.  One pedunculated sigmoid colon polyp removed via snare cautery; others via cold forceps. One 3-mm cecal polyp removed via cold forceps.  Otherwise, no masses, inflammatory changes, diverticular, or arteriovenous malformations seen. simple adenomas. Needs surveillance 2014   COLONOSCOPY WITH ESOPHAGOGASTRODUODENOSCOPY (EGD) N/A 01/25/2013   Dr. harvey: Multiple small ulcers seen in the gastric antrum as well as the duodenum.  Biopsies showed H. pylori gastritis.  Need to come back for 69-month follow-up EGD but he did not have this done.  Colonoscopy showed small internal hemorrhoids, 8 colon polyps removed only one was a simple adenoma.  Next colonoscopy in 10 years.   COLONOSCOPY WITH PROPOFOL  N/A 05/02/2023   Procedure: COLONOSCOPY WITH PROPOFOL ;  Surgeon: Cindie Carlin MARLA, DO;  Location: [facility];  Service: Endoscopy;  Laterality: N/A;   10:30am, asa 2   POLYPECTOMY  05/02/2023   Procedure: POLYPECTOMY;  Surgeon: Cindie Carlin MARLA, DO;  Location: [facility];  Service: Endoscopy;;    Family History  Problem Relation Age of Onset   Cancer Mother    Cancer Father    Colon cancer Neg Hx     SOCIAL HISTORY: Social History   Socioeconomic History   Marital status: Married    Spouse name: Not on file   Number of children: 4   Years of education: Not on file   Highest education level: Not on file  Occupational History    Comment: Retired  Tobacco Use   Smoking status: Every Day    Current packs/day: 0.50    Types: Cigarettes   Smokeless tobacco: Never  Vaping Use   Vaping status: Never Used  Substance and Sexual Activity   Alcohol use: Yes    Comment: Consumes a mix drinking at least 3 beers every day.,  Remote DUI in the 80s.   Drug use: No   Sexual activity: Not on file  Other Topics Concern   Not on file  Social History Narrative   Not on file   Social Drivers of Health   Financial Resource Strain: Not on file  Food Insecurity: Not on file  Transportation Needs: Not on file  Physical Activity: Not on file  Stress: Not on file  Social Connections: Not on file  Intimate Partner Violence: Not  on file    Allergies  Allergen Reactions   Iodine     Current Outpatient Medications  Medication Sig Dispense Refill   amLODipine  (NORVASC ) 10 MG tablet Take 1 tablet (10 mg total) by mouth daily. 90 tablet 3   aspirin EC 81 MG tablet Take 81 mg by mouth daily. Swallow whole.     fluticasone  (FLONASE ) 50 MCG/ACT nasal spray SHAKE LIQUID AND USE 2 SPRAYS IN EACH NOSTRIL EVERY DAY 16 g 2   olmesartan (BENICAR) 20 MG tablet Take 20 mg by mouth daily.     rosuvastatin (CRESTOR) 20 MG tablet Take 20 mg by mouth daily.     No current facility-administered medications for this visit.    REVIEW OF SYSTEMS:  [X]  denotes positive finding, [ ]  denotes negative finding Cardiac  Comments:  Chest pain or chest  pressure:    Shortness of breath upon exertion:    Short of breath when lying flat:    Irregular heart rhythm:        Vascular    Pain in calf, thigh, or hip brought on by ambulation: x Bilateral   Pain in feet at night that wakes you up from your sleep:     Blood clot in your veins:    Leg swelling:         Pulmonary    Oxygen at home:    Productive cough:     Wheezing:         Neurologic    Sudden weakness in arms or legs:     Sudden numbness in arms or legs:     Sudden onset of difficulty speaking or slurred speech:    Temporary loss of vision in one eye:     Problems with dizziness:         Gastrointestinal    Blood in stool:     Vomited blood:         Genitourinary    Burning when urinating:     Blood in urine:        Psychiatric    Major depression:         Hematologic    Bleeding problems:    Problems with blood clotting too easily:        Skin    Rashes or ulcers:        Constitutional    Fever or chills:      PHYSICAL EXAM: Vitals:   09/12/23 1504  BP: (!) 158/86  Pulse: 67  Resp: 18  Temp: 98.1 F (36.7 C)  TempSrc: Temporal  SpO2: 97%  Weight: 174 lb 3.2 oz (79 kg)  Height: 5' 11 (1.803 m)    GENERAL: The patient is a well-nourished male, in no acute distress. The vital signs are documented above. CARDIAC: There is a regular rate and rhythm.  VASCULAR:  Bilateral femoral pulses palpable No palpable pedal pulses No lower extremity tissue loss PULMONARY: No respiratory distress. ABDOMEN: Soft and non-tender. MUSCULOSKELETAL: There are no major deformities or cyanosis. NEUROLOGIC: No focal weakness or paresthesias are detected. SKIN: There are no ulcers or rashes noted. PSYCHIATRIC: The patient has a normal affect.  DATA:   Arterial studies on 08/07/2023 that were 0.66 on the right and 0.61 on the left.  Assessment/Plan:   68 y.o. male, with history of hypertension and tobacco abuse that presents for evaluation of leg blockages.   About 3 months ago started getting leg cramping right calf after walking 1/4 mile.  States he  is has left leg symptoms as well.  Has a hard time walking his dog long distances.  Had to stop mowing yards except for the riding mower and was moving about 5 yards at one time.  Patient had arterial studies on 08/07/2023 that were 0.66 on the right and 0.61 on the left.  I discussed  his symptoms are consistent with intermittent claudication related to underlying peripheral arterial disease.  I discussed we reserve surgical intervention for claudication until it comes lifestyle-limiting.  The fact that he is walking 1/4 mile without stopping is encouraging.  I have recommended medical therapy for now.  He is already on aspirin and statin.  Recommend smoking cessation.  I did prescribe Pletal  twice daily.  I have recommended walking therapies at least 30 minutes a day 3 times a week.  I will see him in 3 months with repeat ABIs.  If symptoms worse may need percutaneous intervention in the future.   Lonni DOROTHA Gaskins, MD [facility] Office: [phone]

## 2023-09-13 ENCOUNTER — Other Ambulatory Visit: Payer: Self-pay

## 2023-09-13 DIAGNOSIS — I70213 Atherosclerosis of native arteries of extremities with intermittent claudication, bilateral legs: Secondary | ICD-10-CM

## 2023-10-19 DIAGNOSIS — D7589 Other specified diseases of blood and blood-forming organs: Secondary | ICD-10-CM | POA: Diagnosis not present

## 2023-10-19 DIAGNOSIS — M79604 Pain in right leg: Secondary | ICD-10-CM | POA: Diagnosis not present

## 2023-10-19 DIAGNOSIS — I10 Essential (primary) hypertension: Secondary | ICD-10-CM | POA: Diagnosis not present

## 2023-10-19 DIAGNOSIS — R002 Palpitations: Secondary | ICD-10-CM | POA: Diagnosis not present

## 2023-10-19 DIAGNOSIS — E782 Mixed hyperlipidemia: Secondary | ICD-10-CM | POA: Diagnosis not present

## 2023-10-23 ENCOUNTER — Ambulatory Visit (INDEPENDENT_AMBULATORY_CARE_PROVIDER_SITE_OTHER): Admitting: Internal Medicine

## 2023-10-23 ENCOUNTER — Encounter (HOSPITAL_BASED_OUTPATIENT_CLINIC_OR_DEPARTMENT_OTHER): Payer: Self-pay | Admitting: *Deleted

## 2023-10-23 ENCOUNTER — Encounter (HOSPITAL_BASED_OUTPATIENT_CLINIC_OR_DEPARTMENT_OTHER): Payer: Self-pay | Admitting: Internal Medicine

## 2023-10-23 VITALS — BP 152/98 | HR 65 | Ht 71.0 in | Wt 173.8 lb

## 2023-10-23 DIAGNOSIS — R5383 Other fatigue: Secondary | ICD-10-CM

## 2023-10-23 DIAGNOSIS — R0602 Shortness of breath: Secondary | ICD-10-CM

## 2023-10-23 DIAGNOSIS — I10 Essential (primary) hypertension: Secondary | ICD-10-CM

## 2023-10-23 DIAGNOSIS — E785 Hyperlipidemia, unspecified: Secondary | ICD-10-CM | POA: Diagnosis not present

## 2023-10-23 DIAGNOSIS — I70213 Atherosclerosis of native arteries of extremities with intermittent claudication, bilateral legs: Secondary | ICD-10-CM

## 2023-10-23 NOTE — Progress Notes (Unsigned)
 LIPID CLINIC CONSULT NOTE  Chief Complaint:  Manage dyslipidemia, shortness of breath, claudication  Primary Care Physician: Verdia Lombard, MD  Primary Cardiologist:  None  HPI:  Gary Harrison is a 68 y.o. male who is being seen today for the evaluation of dyslipidemia at the request of Verdia Lombard, MD. This is a 68 year old male currently referred for evaluation management of dyslipidemia.  He was recently noted to have progressive pain when walking concerning for claudication and had ABIs that were abnormal.  He was referred to Dr. Gretta who felt that he was not having any lifestyle limiting claudication and recommended therapy with Pletal  and continued exercise.  He has a follow-up with him in October.  Of note he had a contrast CT of the chest in 2019 when he fell off a ladder.  The radiologist noted at that time he had moderate three-vessel coronary artery calcification.  He says he does not ever remember being told about this.  He did have recent lipid testing in May 2022 which showed total cholesterol 204, triglycerides 179, HDL 40 and LDL 132 however after starting statin therapy his total cholesterol now is 125 with HDL 45, triglycerides 107 and LDL 60.  Overall this shows pretty good control.  According to his significant other he does get some shortness of breath and has had worsening fatigue.  He has had decreased exercise tolerance and previously worked in Aeronautical engineer.  He denies any frank chest pain.  PMHx:  Past Medical History:  Diagnosis Date   ED (erectile dysfunction)    H pylori ulcer    2014, multiple gastric and duodenal ulcers due to H. pylori.  Treated with amoxicillin , Biaxin , PPI   Hypertension    no meds currently   Impaired fasting glucose    Peripheral vascular disease [facility])     Past Surgical History:  Procedure Laterality Date   COLONOSCOPY  10/18/2007     DOQ:Uymzz descending colon polyps.  The polyps ranged from 8 mm  to 1.2 cm.  One  pedunculated sigmoid colon polyp removed via snare cautery; others via cold forceps. One 3-mm cecal polyp removed via cold forceps.  Otherwise, no masses, inflammatory changes, diverticular, or arteriovenous malformations seen. simple adenomas. Needs surveillance 2014   COLONOSCOPY WITH ESOPHAGOGASTRODUODENOSCOPY (EGD) N/A 01/25/2013   Dr. harvey: Multiple small ulcers seen in the gastric antrum as well as the duodenum.  Biopsies showed H. pylori gastritis.  Need to come back for 59-month follow-up EGD but he did not have this done.  Colonoscopy showed small internal hemorrhoids, 8 colon polyps removed only one was a simple adenoma.  Next colonoscopy in 10 years.   COLONOSCOPY WITH PROPOFOL  N/A 05/02/2023   Procedure: COLONOSCOPY WITH PROPOFOL ;  Surgeon: Cindie Carlin POUR, DO;  [facility];  Service: Endoscopy;  Laterality: N/A;  10:30am, asa 2   POLYPECTOMY  05/02/2023   Procedure: POLYPECTOMY;  Surgeon: Cindie Carlin POUR, DO;  [facility];  Service: Endoscopy;;    FAMHx:  Family History  Problem Relation Age of Onset   Cancer Mother    Cancer Father    Colon cancer Neg Hx     SOCHx:   reports that he has been smoking cigarettes. He has never used smokeless tobacco. He reports current alcohol use. He reports that he does not use drugs.  ALLERGIES:  Allergies  Allergen Reactions   Iodine     ROS: Pertinent items noted in HPI and remainder of comprehensive ROS  otherwise negative.  HOME MEDS: Current Outpatient Medications on File Prior to Visit  Medication Sig Dispense Refill   amLODipine  (NORVASC ) 10 MG tablet Take 1 tablet (10 mg total) by mouth daily. 90 tablet 3   aspirin EC 81 MG tablet Take 81 mg by mouth daily. Swallow whole.     cilostazol  (PLETAL ) 100 MG tablet Take 1 tablet (100 mg total) by mouth 2 (two) times daily before a meal. 60 tablet 11   fluticasone  (FLONASE ) 50 MCG/ACT nasal spray SHAKE LIQUID AND USE 2 SPRAYS IN EACH NOSTRIL EVERY DAY 16 g 2    olmesartan (BENICAR) 20 MG tablet Take 20 mg by mouth daily.     rosuvastatin (CRESTOR) 20 MG tablet Take 20 mg by mouth daily.     No current facility-administered medications on file prior to visit.    LABS/IMAGING: No results found for this or any previous visit (from the past 48 hours). No results found.  LIPID PANEL:    Component Value Date/Time   CHOL 192 01/15/2020 1142   TRIG 154 (H) 01/15/2020 1142   HDL 44 01/15/2020 1142   CHOLHDL 4.4 01/15/2020 1142   CHOLHDL 4.8 01/11/2013 1034   VLDL 60 (H) 01/11/2013 1034   LDLCALC 121 (H) 01/15/2020 1142    No results found for: LIPOA   WEIGHTS: Wt Readings from Last 3 Encounters:  09/12/23 174 lb 3.2 oz (79 kg)  05/02/23 175 lb (79.4 kg)  03/30/20 178 lb 12.8 oz (81.1 kg)    VITALS: There were no vitals taken for this visit.  EXAM: General appearance: alert and no distress Lungs: clear to auscultation bilaterally Heart: regular rate and rhythm Extremities: extremities normal, atraumatic, no cyanosis or edema Neurologic: Grossly normal  EKG: Deferred  ASSESSMENT: Progressive dyspnea on exertion and fatigue Three-vessel coronary artery calcification PAD with persistent claudication Dyslipidemia, goal LDL less than 70 Hypertension, uncontrolled  PLAN: 1.   Mr. Goines has had improved lipid control and now is at target on statin therapy.  He has however had some progressive dyspnea on exertion and fatigue.  He was noted to have three-vessel coronary artery calcification by CT in 2019.  He has not had an ischemic evaluation.  He does have PAD with persistent claudication despite being placed on Pletal  and has an upcoming appointment with Dr. Gretta.  He may need a peripheral angiogram at that time and if he has bypassable disease would probably need an ischemic evaluation prior to that.  Based on his progressive symptoms I like to proceed with a nuclear stress test at this time.  Further recommendations based on  that.  His blood pressure also remains uncontrolled.  He may need further titration of his medications afterwards.  Thanks again for the kind referral.  Vinie KYM Maxcy, MD, [facility], FNLA, FACP  [place]  [facility]  Medical Director of the [facility] Lipid Disorders &  Cardiovascular Risk Reduction Clinic Diplomate of the American Board of Clinical Lipidology Attending Cardiologist  Direct Dial: [phone]  Fax: [phone]  Website:  [email or web address] Vinie BROCKS Leotis Isham 10/23/2023, 1:23 PM

## 2023-10-23 NOTE — Patient Instructions (Signed)
 Medication Instructions:   Your physician recommends that you continue on your current medications as directed. Please refer to the Current Medication list given to you today.  *If you need a refill on your cardiac medications before your next appointment, please call your pharmacy*    Testing/Procedures:  Your physician has requested that you have a lexiscan myoview. For further information please visit [email or web address] Please follow instruction sheet, as given.  PLEASE SCHEDULE THIS TO BE DONE AT [facility]    Follow-Up:  AS NEEDED WITH DR. HILTY

## 2023-10-26 DIAGNOSIS — E782 Mixed hyperlipidemia: Secondary | ICD-10-CM | POA: Diagnosis not present

## 2023-10-26 DIAGNOSIS — I739 Peripheral vascular disease, unspecified: Secondary | ICD-10-CM | POA: Diagnosis not present

## 2023-10-26 DIAGNOSIS — D7589 Other specified diseases of blood and blood-forming organs: Secondary | ICD-10-CM | POA: Diagnosis not present

## 2023-10-26 DIAGNOSIS — R002 Palpitations: Secondary | ICD-10-CM | POA: Diagnosis not present

## 2023-10-26 DIAGNOSIS — I10 Essential (primary) hypertension: Secondary | ICD-10-CM | POA: Diagnosis not present

## 2023-10-30 ENCOUNTER — Telehealth (HOSPITAL_COMMUNITY): Payer: Self-pay

## 2023-10-31 ENCOUNTER — Ambulatory Visit (HOSPITAL_COMMUNITY)
Admission: RE | Admit: 2023-10-31 | Discharge: 2023-10-31 | Disposition: A | Discharge status: Home / Self Care | Source: Ambulatory Visit | Attending: Internal Medicine | Admitting: Internal Medicine

## 2023-10-31 ENCOUNTER — Encounter (HOSPITAL_COMMUNITY): Payer: Self-pay

## 2023-10-31 ENCOUNTER — Ambulatory Visit: Payer: Self-pay | Admitting: Internal Medicine

## 2023-10-31 ENCOUNTER — Other Ambulatory Visit: Payer: Self-pay | Admitting: Student

## 2023-10-31 DIAGNOSIS — I70213 Atherosclerosis of native arteries of extremities with intermittent claudication, bilateral legs: Secondary | ICD-10-CM | POA: Insufficient documentation

## 2023-10-31 DIAGNOSIS — R0602 Shortness of breath: Secondary | ICD-10-CM | POA: Insufficient documentation

## 2023-10-31 LAB — NM MYOCAR MULTI W/SPECT W/WALL MOTION / EF
Estimated workload: 1
Exercise duration (min): 0 min
Exercise duration (sec): 0 s
LV dias vol: 125 mL (ref 62–150)
LV sys vol: 43 mL (ref 4.2–5.8)
MPHR: 154 {beats}/min
Nuc Stress EF: 66 %
Peak HR: 98 {beats}/min
Percent HR: 63 %
RATE: 0.3
Rest HR: 66 {beats}/min
Rest Nuclear Isotope Dose: 9.9 mCi
SDS: 1
SRS: 2
SSS: 3
Stress Nuclear Isotope Dose: 33 mCi
TID: 1.06

## 2023-10-31 MED ORDER — TECHNETIUM TC 99M TETROFOSMIN IV KIT
10.0000 | PACK | Freq: Once | INTRAVENOUS | Status: AC | PRN
  Administered 2023-10-31: 9.9 via INTRAVENOUS

## 2023-10-31 MED ORDER — TECHNETIUM TC 99M TETROFOSMIN IV KIT
30.0000 | PACK | Freq: Once | INTRAVENOUS | Status: AC | PRN
  Administered 2023-10-31: 33 via INTRAVENOUS

## 2023-10-31 MED ORDER — SODIUM CHLORIDE FLUSH 0.9 % IV SOLN
INTRAVENOUS | Status: AC
  Filled 2023-10-31: qty 10

## 2023-10-31 MED ORDER — REGADENOSON 0.4 MG/5ML IV SOLN
INTRAVENOUS | Status: AC
  Administered 2023-10-31: 0.4 mg via INTRAVENOUS
  Filled 2023-10-31: qty 5

## 2023-10-31 NOTE — Progress Notes (Signed)
     Gary Harrison presented for a Lexiscan  nuclear stress test today. I Laymon CHRISTELLA Qua, PA-C, provided direct supervision and was present during the stress portion of the study today, which was completed without significant symptoms, immediate complications, or acute ST/T changes on ECG.  Stress imaging is pending at this time.  Preliminary ECG findings may be listed in the chart, but the stress test result will not be finalized until perfusion imaging is complete.  Laymon CHRISTELLA Qua, PA-C  10/31/2023, 10:32 AM

## 2023-12-26 ENCOUNTER — Ambulatory Visit: Attending: Vascular Surgery | Admitting: Vascular Surgery

## 2023-12-26 ENCOUNTER — Other Ambulatory Visit: Payer: Self-pay

## 2023-12-26 ENCOUNTER — Encounter: Payer: Self-pay | Admitting: Vascular Surgery

## 2023-12-26 ENCOUNTER — Ambulatory Visit (HOSPITAL_COMMUNITY)
Admission: RE | Admit: 2023-12-26 | Discharge: 2023-12-26 | Disposition: A | Discharge status: Home / Self Care | Source: Ambulatory Visit | Attending: Vascular Surgery | Admitting: Vascular Surgery

## 2023-12-26 VITALS — BP 135/74 | HR 75 | Temp 98.0°F | Resp 18 | Ht 71.0 in | Wt 167.2 lb

## 2023-12-26 DIAGNOSIS — I70229 Atherosclerosis of native arteries of extremities with rest pain, unspecified extremity: Secondary | ICD-10-CM | POA: Insufficient documentation

## 2023-12-26 DIAGNOSIS — I70221 Atherosclerosis of native arteries of extremities with rest pain, right leg: Secondary | ICD-10-CM

## 2023-12-26 DIAGNOSIS — I70213 Atherosclerosis of native arteries of extremities with intermittent claudication, bilateral legs: Secondary | ICD-10-CM

## 2023-12-26 LAB — VAS US ABI WITH/WO TBI
Left ABI: 0.45
Right ABI: 0.33

## 2023-12-26 NOTE — Progress Notes (Signed)
 Patient name: Gary Harrison MRN: [id number] DOB: 1955-09-06 Sex: male  REASON FOR CONSULT: 3 month follow-up claudication  HPI: Gary Harrison is a 68 y.o. male, with history of hypertension and tobacco abuse that presents for 54-month follow-up of claudication.  Previously elected conservative management with Pletal  and walking therapies.  Today he states his right leg is bothering him more.  Can only walk about 100 yards and has to stop due to cramping in the right calf.  Now having numbness in the right foot that is worsening.  Does not feel the Pletal  helped at all.  Past Medical History:  Diagnosis Date   ED (erectile dysfunction)    H pylori ulcer    2014, multiple gastric and duodenal ulcers due to H. pylori.  Treated with amoxicillin , Biaxin , PPI   Hypertension    no meds currently   Impaired fasting glucose    Peripheral vascular disease     Past Surgical History:  Procedure Laterality Date   COLONOSCOPY  10/18/2007     DOQ:Uymzz descending colon polyps.  The polyps ranged from 8 mm  to 1.2 cm.  One pedunculated sigmoid colon polyp removed via snare cautery; others via cold forceps. One 3-mm cecal polyp removed via cold forceps.  Otherwise, no masses, inflammatory changes, diverticular, or arteriovenous malformations seen. simple adenomas. Needs surveillance 2014   COLONOSCOPY WITH ESOPHAGOGASTRODUODENOSCOPY (EGD) N/A 01/25/2013   Dr. harvey: Multiple small ulcers seen in the gastric antrum as well as the duodenum.  Biopsies showed H. pylori gastritis.  Need to come back for 27-month follow-up EGD but he did not have this done.  Colonoscopy showed small internal hemorrhoids, 8 colon polyps removed only one was a simple adenoma.  Next colonoscopy in 10 years.   COLONOSCOPY WITH PROPOFOL  N/A 05/02/2023   Procedure: COLONOSCOPY WITH PROPOFOL ;  Surgeon: Cindie Carlin MARLA, DO;  [facility];  Service: Endoscopy;  Laterality: N/A;  10:30am, asa 2   POLYPECTOMY  05/02/2023    Procedure: POLYPECTOMY;  Surgeon: Cindie Carlin MARLA, DO;  [facility];  Service: Endoscopy;;    Family History  Problem Relation Age of Onset   Cancer Mother    Cancer Father    Colon cancer Neg Hx     SOCIAL HISTORY: Social History   Socioeconomic History   Marital status: Married    Spouse name: Not on file   Number of children: 4   Years of education: Not on file   Highest education level: Not on file  Occupational History    Comment: Retired  Tobacco Use   Smoking status: Every Day    Current packs/day: 0.50    Types: Cigarettes   Smokeless tobacco: Never   Tobacco comments:    Under a pack a day, 15 a day, does not want to quit  Vaping Use   Vaping status: Never Used  Substance and Sexual Activity   Alcohol use: Yes    Comment: Consumes a mix drinking at least 3 beers every day.,  Remote DUI in the 80s.   Drug use: No   Sexual activity: Not on file  Other Topics Concern   Not on file  Social History Narrative   Not on file   Social Drivers of Health   Financial Resource Strain: Not on file  Food Insecurity: Not on file  Transportation Needs: Not on file  Physical Activity: Not on file  Stress: Not on file  Social Connections: Not on file  Intimate Partner Violence: Not on file    Allergies  Allergen Reactions   Iodine     Current Outpatient Medications  Medication Sig Dispense Refill   amLODipine  (NORVASC ) 10 MG tablet Take 1 tablet (10 mg total) by mouth daily. 90 tablet 3   aspirin EC 81 MG tablet Take 81 mg by mouth daily. Swallow whole.     cilostazol  (PLETAL ) 100 MG tablet Take 1 tablet (100 mg total) by mouth 2 (two) times daily before a meal. 60 tablet 11   fluticasone  (FLONASE ) 50 MCG/ACT nasal spray SHAKE LIQUID AND USE 2 SPRAYS IN EACH NOSTRIL EVERY DAY 16 g 2   olmesartan (BENICAR) 20 MG tablet Take 20 mg by mouth daily.     rosuvastatin (CRESTOR) 20 MG tablet Take 20 mg by mouth daily.     No current  facility-administered medications for this visit.    REVIEW OF SYSTEMS:  [X]  denotes positive finding, [ ]  denotes negative finding Cardiac  Comments:  Chest pain or chest pressure:    Shortness of breath upon exertion:    Short of breath when lying flat:    Irregular heart rhythm:        Vascular    Pain in calf, thigh, or hip brought on by ambulation: x Bilateral   Pain in feet at night that wakes you up from your sleep:     Blood clot in your veins:    Leg swelling:         Pulmonary    Oxygen at home:    Productive cough:     Wheezing:         Neurologic    Sudden weakness in arms or legs:     Sudden numbness in arms or legs:     Sudden onset of difficulty speaking or slurred speech:    Temporary loss of vision in one eye:     Problems with dizziness:         Gastrointestinal    Blood in stool:     Vomited blood:         Genitourinary    Burning when urinating:     Blood in urine:        Psychiatric    Major depression:         Hematologic    Bleeding problems:    Problems with blood clotting too easily:        Skin    Rashes or ulcers:        Constitutional    Fever or chills:      PHYSICAL EXAM: Vitals:   12/26/23 1528  BP: 135/74  Pulse: 75  Resp: 18  Temp: 98 F (36.7 C)  TempSrc: Temporal  SpO2: 98%  Weight: 167 lb 3.2 oz (75.8 kg)  Height: 5' 11 (1.803 m)    GENERAL: The patient is a well-nourished male, in no acute distress. The vital signs are documented above. CARDIAC: There is a regular rate and rhythm.  VASCULAR:  Bilateral femoral pulses palpable No palpable pedal pulses No lower extremity tissue loss PULMONARY: No respiratory distress. ABDOMEN: Soft and non-tender. MUSCULOSKELETAL: There are no major deformities or cyanosis. NEUROLOGIC: No focal weakness or paresthesias are detected. SKIN: There are no ulcers or rashes noted. PSYCHIATRIC: The patient has a normal affect.  DATA:   ABIs today are 0.33 on the right and  0.45 on the left monophasic (previously 0.66 on the right and 0.61 on the left)  Assessment/Plan:  68 y.o. male, with history of hypertension and tobacco abuse that presents for 51-month follow-up of intermittent claudication.  Unfortunately has had worsening symptoms during a trial of medical management.  Now with increasing right calf claudication after just 100 yards with some numbness in the foot is concerning for rest pain.  ABI on the right is very depressed at 0.33.  I have recommended aortogram, lower extremity arteriogram with a focus on the right leg initially since this leg is more symptomatic but will shoot left leg runoff as well.  Discussed will evaluate for any endovascular percutaneous intervention options.  He has failed conservative management after an appropriate trial.  Ultimately may require open surgery including surgical bypass as we discussed.  Will get scheduled as an outpatient at [facility].   Lonni DOROTHA Gaskins, MD [facility] [place] Office: [phone]

## 2024-01-18 ENCOUNTER — Ambulatory Visit (HOSPITAL_COMMUNITY)

## 2024-01-18 ENCOUNTER — Encounter (HOSPITAL_COMMUNITY)
Admission: RE | Disposition: A | Discharge status: Home / Self Care | Payer: Self-pay | Source: Home / Self Care | Attending: Vascular Surgery

## 2024-01-18 ENCOUNTER — Other Ambulatory Visit: Payer: Self-pay

## 2024-01-18 ENCOUNTER — Ambulatory Visit (HOSPITAL_COMMUNITY)
Admission: RE | Admit: 2024-01-18 | Discharge: 2024-01-18 | Disposition: A | Discharge status: Home / Self Care | Attending: Vascular Surgery | Admitting: Vascular Surgery

## 2024-01-18 DIAGNOSIS — I70213 Atherosclerosis of native arteries of extremities with intermittent claudication, bilateral legs: Secondary | ICD-10-CM | POA: Diagnosis not present

## 2024-01-18 DIAGNOSIS — Z01810 Encounter for preprocedural cardiovascular examination: Secondary | ICD-10-CM

## 2024-01-18 DIAGNOSIS — I70212 Atherosclerosis of native arteries of extremities with intermittent claudication, left leg: Secondary | ICD-10-CM

## 2024-01-18 DIAGNOSIS — Z79899 Other long term (current) drug therapy: Secondary | ICD-10-CM | POA: Diagnosis not present

## 2024-01-18 DIAGNOSIS — I10 Essential (primary) hypertension: Secondary | ICD-10-CM | POA: Insufficient documentation

## 2024-01-18 DIAGNOSIS — Z7902 Long term (current) use of antithrombotics/antiplatelets: Secondary | ICD-10-CM | POA: Insufficient documentation

## 2024-01-18 DIAGNOSIS — F17210 Nicotine dependence, cigarettes, uncomplicated: Secondary | ICD-10-CM | POA: Insufficient documentation

## 2024-01-18 DIAGNOSIS — I70221 Atherosclerosis of native arteries of extremities with rest pain, right leg: Secondary | ICD-10-CM | POA: Diagnosis not present

## 2024-01-18 HISTORY — PX: LOWER EXTREMITY ANGIOGRAPHY: CATH118251

## 2024-01-18 HISTORY — PX: ABDOMINAL AORTOGRAM W/LOWER EXTREMITY: CATH118223

## 2024-01-18 LAB — POCT I-STAT, CHEM 8
BUN: 13 mg/dL (ref 8–23)
Calcium, Ion: 1.34 mmol/L (ref 1.15–1.40)
Chloride: 98 mmol/L (ref 98–111)
Creatinine, Ser: 1.1 mg/dL (ref 0.61–1.24)
Glucose, Bld: 102 mg/dL — ABNORMAL HIGH (ref 70–99)
HCT: 43 % (ref 39.0–52.0)
Hemoglobin: 14.6 g/dL (ref 13.0–17.0)
Potassium: 3.4 mmol/L — ABNORMAL LOW (ref 3.5–5.1)
Sodium: 142 mmol/L (ref 135–145)
TCO2: 32 mmol/L (ref 22–32)

## 2024-01-18 MED ORDER — FENTANYL CITRATE (PF) 100 MCG/2ML IJ SOLN
INTRAMUSCULAR | Status: DC | PRN
  Administered 2024-01-18: 25 ug via INTRAVENOUS

## 2024-01-18 MED ORDER — SODIUM CHLORIDE 0.9 % IV SOLN: INTRAVENOUS | Status: DC

## 2024-01-18 MED ORDER — SODIUM CHLORIDE 0.9% FLUSH: 3.0000 mL | Freq: Two times a day (BID) | INTRAVENOUS | Status: DC

## 2024-01-18 MED ORDER — ACETAMINOPHEN 325 MG PO TABS: 650.0000 mg | ORAL_TABLET | ORAL | Status: DC | PRN

## 2024-01-18 MED ORDER — MIDAZOLAM HCL 2 MG/2ML IJ SOLN
INTRAMUSCULAR | Status: AC
  Filled 2024-01-18: qty 2

## 2024-01-18 MED ORDER — FENTANYL CITRATE (PF) 100 MCG/2ML IJ SOLN
INTRAMUSCULAR | Status: AC
  Filled 2024-01-18: qty 2

## 2024-01-18 MED ORDER — HYDRALAZINE HCL 20 MG/ML IJ SOLN: 5.0000 mg | INTRAMUSCULAR | Status: DC | PRN

## 2024-01-18 MED ORDER — OXYCODONE HCL 5 MG PO TABS: 5.0000 mg | ORAL_TABLET | ORAL | Status: DC | PRN

## 2024-01-18 MED ORDER — DIPHENHYDRAMINE HCL 50 MG/ML IJ SOLN
25.0000 mg | Freq: Once | INTRAMUSCULAR | Status: AC
  Administered 2024-01-18: 25 mg via INTRAVENOUS
  Filled 2024-01-18: qty 1

## 2024-01-18 MED ORDER — IODIXANOL 320 MG/ML IV SOLN
INTRAVENOUS | Status: DC | PRN
  Administered 2024-01-18: 85 mL

## 2024-01-18 MED ORDER — LIDOCAINE HCL (PF) 1 % IJ SOLN
INTRAMUSCULAR | Status: AC
  Filled 2024-01-18: qty 30

## 2024-01-18 MED ORDER — SODIUM CHLORIDE 0.9 % IV SOLN: INTRAVENOUS | Status: AC

## 2024-01-18 MED ORDER — ONDANSETRON HCL 4 MG/2ML IJ SOLN: 4.0000 mg | Freq: Four times a day (QID) | INTRAMUSCULAR | Status: DC | PRN

## 2024-01-18 MED ORDER — HEPARIN (PORCINE) IN NACL 1000-0.9 UT/500ML-% IV SOLN
INTRAVENOUS | Status: DC | PRN
  Administered 2024-01-18 (×2): 500 mL

## 2024-01-18 MED ORDER — MIDAZOLAM HCL (PF) 2 MG/2ML IJ SOLN
INTRAMUSCULAR | Status: DC | PRN
  Administered 2024-01-18: 1 mg via INTRAVENOUS

## 2024-01-18 MED ORDER — SODIUM CHLORIDE 0.9 % IV SOLN: 250.0000 mL | INTRAVENOUS | Status: DC | PRN

## 2024-01-18 MED ORDER — METHYLPREDNISOLONE SODIUM SUCC 125 MG IJ SOLR
125.0000 mg | Freq: Once | INTRAMUSCULAR | Status: AC
  Administered 2024-01-18: 125 mg via INTRAVENOUS
  Filled 2024-01-18: qty 2

## 2024-01-18 MED ORDER — LIDOCAINE HCL (PF) 1 % IJ SOLN
INTRAMUSCULAR | Status: DC | PRN
  Administered 2024-01-18: 10 mL via INTRADERMAL

## 2024-01-18 MED ORDER — LABETALOL HCL 5 MG/ML IV SOLN: 10.0000 mg | INTRAVENOUS | Status: DC | PRN

## 2024-01-18 MED ORDER — SODIUM CHLORIDE 0.9% FLUSH: 3.0000 mL | INTRAVENOUS | Status: DC | PRN

## 2024-01-18 NOTE — Discharge Instructions (Signed)
 Femoral Site Care This sheet gives you information about how to care for yourself after your procedure. Your health care provider may also give you more specific instructions. If you have problems or questions, contact your health care provider. What can I expect after the procedure?  After the procedure, it is common to have: Bruising that usually fades within 1-2 weeks. Tenderness at the site. Follow these instructions at home: Wound care Follow instructions from your health care provider about how to take care of your insertion site. Make sure you: Wash your hands with soap and water  before you change your bandage (dressing). If soap and water  are not available, use hand sanitizer. Remove your dressing as told by your health care provider. 24 hours Do not take baths, swim, or use a hot tub until your health care provider approves. You may shower 24-48 hours after the procedure or as told by your health care provider. Gently wash the site with plain soap and water . Pat the area dry with a clean towel. Do not rub the site. This may cause bleeding. Do not apply powder or lotion to the site. Keep the site clean and dry. Check your femoral site every day for signs of infection. Check for: Redness, swelling, or pain. Fluid or blood. Warmth. Pus or a bad smell. Activity For the first 2-3 days after your procedure, or as long as directed: Avoid climbing stairs as much as possible. Do not squat. Do not lift anything that is heavier than 10 lb (4.5 kg), or the limit that you are told, until your health care provider says that it is safe. For 5 days Rest as directed. Avoid sitting for a long time without moving. Get up to take short walks every 1-2 hours. Do not drive for 24 hours if you were given a medicine to help you relax (sedative). General instructions Take over-the-counter and prescription medicines only as told by your health care provider. Keep all follow-up visits as told by your  health care provider. This is important. Contact a health care provider if you have: A fever or chills. You have redness, swelling, or pain around your insertion site. Get help right away if: The catheter insertion area swells very fast. You pass out. You suddenly start to sweat or your skin gets clammy. The catheter insertion area is bleeding, and the bleeding does not stop when you hold steady pressure on the area. The area near or just beyond the catheter insertion site becomes pale, cool, tingly, or numb. These symptoms may represent a serious problem that is an emergency. Do not wait to see if the symptoms will go away. Get medical help right away. Call your local emergency services (911 in the [place] Do not drive yourself to the hospital. Summary After the procedure, it is common to have bruising that usually fades within 1-2 weeks. Check your femoral site every day for signs of infection. Do not lift anything that is heavier than 10 lb (4.5 kg), or the limit that you are told, until your health care provider says that it is safe. This information is not intended to replace advice given to you by your health care provider. Make sure you discuss any questions you have with your health care provider. Document Revised: 03/06/2017 Document Reviewed: 03/06/2017 Elsevier Patient Education  2020 [employer].

## 2024-01-18 NOTE — H&P (Signed)
 History and Physical Interval Note:  01/18/2024 7:42 AM  Gary Harrison  has presented today for surgery, with the diagnosis of atherosclerosis bilatteral lower.  The various methods of treatment have been discussed with the patient and family. After consideration of risks, benefits and other options for treatment, the patient has consented to  Procedure(s): ABDOMINAL AORTOGRAM W/LOWER EXTREMITY (N/A) Lower Extremity Angiography (N/A) LOWER EXTREMITY INTERVENTION (N/A) as a surgical intervention.  The patient's history has been reviewed, patient examined, no change in status, stable for surgery.  I have reviewed the patient's chart and labs.  Questions were answered to the patient's satisfaction.     Lonni JINNY Gaskins     Patient name: Gary Harrison      MRN: [id number]        DOB: 08/07/1955          Sex: male   REASON FOR CONSULT: 3 month follow-up claudication   HPI: Gary Harrison is a 68 y.o. male, with history of hypertension and tobacco abuse that presents for 31-month follow-up of claudication.  Previously elected conservative management with Pletal  and walking therapies.  Today he states his right leg is bothering him more.  Can only walk about 100 yards and has to stop due to cramping in the right calf.  Now having numbness in the right foot that is worsening.  Does not feel the Pletal  helped at all.       Past Medical History:  Diagnosis Date   ED (erectile dysfunction)     H pylori ulcer      2014, multiple gastric and duodenal ulcers due to H. pylori.  Treated with amoxicillin , Biaxin , PPI   Hypertension      no meds currently   Impaired fasting glucose     Peripheral vascular disease                 Past Surgical History:  Procedure Laterality Date   COLONOSCOPY   10/18/2007      DOQ:Uymzz descending colon polyps.  The polyps ranged from 8 mm  to 1.2 cm.  One pedunculated sigmoid colon polyp removed via snare cautery; others via cold forceps. One 3-mm cecal  polyp removed via cold forceps.  Otherwise, no masses, inflammatory changes, diverticular, or arteriovenous malformations seen. simple adenomas. Needs surveillance 2014   COLONOSCOPY WITH ESOPHAGOGASTRODUODENOSCOPY (EGD) N/A 01/25/2013    Dr. harvey: Multiple small ulcers seen in the gastric antrum as well as the duodenum.  Biopsies showed H. pylori gastritis.  Need to come back for 62-month follow-up EGD but he did not have this done.  Colonoscopy showed small internal hemorrhoids, 8 colon polyps removed only one was a simple adenoma.  Next colonoscopy in 10 years.   COLONOSCOPY WITH PROPOFOL  N/A 05/02/2023    Procedure: COLONOSCOPY WITH PROPOFOL ;  Surgeon: Cindie Carlin MARLA, DO;  [facility];  Service: Endoscopy;  Laterality: N/A;  10:30am, asa 2   POLYPECTOMY   05/02/2023    Procedure: POLYPECTOMY;  Surgeon: Cindie Carlin MARLA, DO;  [facility];  Service: Endoscopy;;               Family History  Problem Relation Age of Onset   Cancer Mother     Cancer Father     Colon cancer Neg Hx            SOCIAL HISTORY: Social History         Socioeconomic History   Marital status: Married  Spouse name: Not on file   Number of children: 4   Years of education: Not on file   Highest education level: Not on file  Occupational History      Comment: Retired  Tobacco Use   Smoking status: Every Day      Current packs/day: 0.50      Types: Cigarettes   Smokeless tobacco: Never   Tobacco comments:      Under a pack a day, 15 a day, does not want to quit  Vaping Use   Vaping status: Never Used  Substance and Sexual Activity   Alcohol use: Yes      Comment: Consumes a mix drinking at least 3 beers every day.,  Remote DUI in the 80s.   Drug use: No   Sexual activity: Not on file  Other Topics Concern   Not on file  Social History Narrative   Not on file    Social Drivers of Health    Financial Resource Strain: Not on file  Food Insecurity: Not on file   Transportation Needs: Not on file  Physical Activity: Not on file  Stress: Not on file  Social Connections: Not on file  Intimate Partner Violence: Not on file      Allergies      Allergies  Allergen Reactions   Iodine                Current Outpatient Medications  Medication Sig Dispense Refill   amLODipine  (NORVASC ) 10 MG tablet Take 1 tablet (10 mg total) by mouth daily. 90 tablet 3   aspirin EC 81 MG tablet Take 81 mg by mouth daily. Swallow whole.       cilostazol  (PLETAL ) 100 MG tablet Take 1 tablet (100 mg total) by mouth 2 (two) times daily before a meal. 60 tablet 11   fluticasone  (FLONASE ) 50 MCG/ACT nasal spray SHAKE LIQUID AND USE 2 SPRAYS IN EACH NOSTRIL EVERY DAY 16 g 2   olmesartan (BENICAR) 20 MG tablet Take 20 mg by mouth daily.       rosuvastatin (CRESTOR) 20 MG tablet Take 20 mg by mouth daily.          No current facility-administered medications for this visit.        REVIEW OF SYSTEMS:  [X]  denotes positive finding, [ ]  denotes negative finding Cardiac   Comments:  Chest pain or chest pressure:      Shortness of breath upon exertion:      Short of breath when lying flat:      Irregular heart rhythm:             Vascular      Pain in calf, thigh, or hip brought on by ambulation: x Bilateral   Pain in feet at night that wakes you up from your sleep:       Blood clot in your veins:      Leg swelling:              Pulmonary      Oxygen at home:      Productive cough:       Wheezing:              Neurologic      Sudden weakness in arms or legs:       Sudden numbness in arms or legs:       Sudden onset of difficulty speaking or slurred speech:      Temporary loss of  vision in one eye:       Problems with dizziness:              Gastrointestinal      Blood in stool:       Vomited blood:              Genitourinary      Burning when urinating:       Blood in urine:             Psychiatric      Major depression:              Hematologic       Bleeding problems:      Problems with blood clotting too easily:             Skin      Rashes or ulcers:             Constitutional      Fever or chills:          PHYSICAL EXAM:    Vitals:    12/26/23 1528  BP: 135/74  Pulse: 75  Resp: 18  Temp: 98 F (36.7 C)  TempSrc: Temporal  SpO2: 98%  Weight: 167 lb 3.2 oz (75.8 kg)  Height: 5' 11 (1.803 m)      GENERAL: The patient is a well-nourished male, in no acute distress. The vital signs are documented above. CARDIAC: There is a regular rate and rhythm.  VASCULAR:  Bilateral femoral pulses palpable No palpable pedal pulses No lower extremity tissue loss PULMONARY: No respiratory distress. ABDOMEN: Soft and non-tender. MUSCULOSKELETAL: There are no major deformities or cyanosis. NEUROLOGIC: No focal weakness or paresthesias are detected. SKIN: There are no ulcers or rashes noted. PSYCHIATRIC: The patient has a normal affect.   DATA:    ABIs today are 0.33 on the right and 0.45 on the left monophasic (previously 0.66 on the right and 0.61 on the left)   Assessment/Plan:    68 y.o. male, with history of hypertension and tobacco abuse that presents for 49-month follow-up of intermittent claudication.  Unfortunately has had worsening symptoms during a trial of medical management.  Now with increasing right calf claudication after just 100 yards with some numbness in the foot is concerning for rest pain.  ABI on the right is very depressed at 0.33.  I have recommended aortogram, lower extremity arteriogram with a focus on the right leg initially since this leg is more symptomatic but will shoot left leg runoff as well.  Discussed will evaluate for any endovascular percutaneous intervention options.  He has failed conservative management after an appropriate trial.  Ultimately may require open surgery including surgical bypass as we discussed.  Will get scheduled as an outpatient at [facility].     Lonni DOROTHA Gaskins, MD [facility] [place] Office: [phone]

## 2024-01-18 NOTE — Progress Notes (Signed)
 VASCULAR LAB    Bilateral lower extremity venous duplex has been performed.  See CV proc for preliminary results.   Zephaniah Enyeart, RVT 01/18/2024, 9:58 AM

## 2024-01-18 NOTE — Progress Notes (Signed)
 Site area: L femoral Site Prior to Removal:  Level 0 Pressure Applied For: 25 min Manual:   yes Patient Status During Pull:  stable Post Pull Site:  Level 0 Post Pull Instructions Given:  yes Post Pull Pulses Present: bilateral PT by doppler Dressing Applied:  gauze & tegaderm Bedrest begins @ 0850 Comments:

## 2024-01-18 NOTE — Progress Notes (Signed)
 Ambulated in the hall and to the bathroom. Left groin site clean, dry, and intact. No bleeding or hematoma noted. Patient dressed for discharge.

## 2024-01-18 NOTE — Op Note (Signed)
 Patient name: Gary Harrison MRN: [id number] DOB: 22-Jan-1956 Sex: male  01/18/2024 Pre-operative Diagnosis: Disabling bilateral lower extremity claudication now with rest pain in the right foot Post-operative diagnosis:  Same Surgeon:  Lonni DOROTHA Gaskins, MD Procedure Performed: 1.  Ultrasound-guided access left common femoral artery 2.  Aortogram with catheter selection of aorta 3.  Bilateral lower extremity arteriogram with catheter in the abdominal aorta 4.  Right iliac pressure gradient 5.  19 minutes of monitored moderate conscious sedation time  Indications: 68 year old male seen with disabling bilateral lower extremity claudication that has failed conservative management.  Now with increasing numbness in the right foot concerning for rest pain.  He presents for aortogram, bilateral lower extremity arteriogram with initial focus on the right leg after risk benefits discussed.  Findings:   Ultrasound-guided access left common femoral artery that was very diseased.  Catheter selection of aorta and abdominal aortogram showed patent infrarenal aorta with patent bilateral renal arteries and patent bilateral common and external iliac arteries without flow-limiting stenosis.  All of his arteries are calcified and diffusely diseased.  On the right his common femoral is patent in the SFA has a flush occlusion with a high-grade stenosis versus subtotal occlusion of the distal common femoral artery and proximal profunda.  SFA on the right reconstitutes in the mid segment with a second high-grade stenosis in the above-knee pop at Hunter's canal.  He does have a patent below-knee popliteal artery with three-vessel runoff.  On the left his common femoral artery is heavily diseased with a high-grade over 80% stenosis and flush SFA occlusion.  Profunda is patent.  The SFA reconstitutes in the distal segment with a patent popliteal artery and two-vessel runoff in the posterior tibial and peroneal.   The anterior tibial is occluded and disease.   Procedure:  The patient was identified in the holding area and taken to room 8.  The patient was then placed supine on the table and prepped and draped in the usual sterile fashion.  A time out was called.  Patient received Versed  and fentanyl for conscious moderate sedation.  Vital signs are monitored including heart rate, respiratory rate, oxygenation and blood pressure.  I was present for all moderate sedation.  Ultrasound was used to evaluate the left common femoral artery.  It was patent .  A digital ultrasound image was acquired.  A micropuncture needle was used to access the left common femoral artery under ultrasound guidance.  An 018 wire was advanced without resistance and a micropuncture sheath was placed.  The 018 wire was removed and a benson wire was placed.  The micropuncture sheath was exchanged for a 5 french sheath.  An omniflush catheter was advanced over the wire to the level of L-1.  An abdominal angiogram was obtained.  We pulled the catheter down and elected to do bilateral lower extremity runoff.  Pertinent findings are noted above.  I did elect to cross the aortic bifurcation and get my catheter into the right iliac in order to evaluate for any flow-limiting stenosis in the right iliac artery that looked calcified.  The pressure in the right external iliac artery was 160 mmHg and a pullback into the abdominal aorta showed no drop with no pressure gradient.  Ultimately patient will need surgical revascularization.  Wires and catheters were removed.  To be taken holding for holding of his left groin sheath.  Plan: The patient will need right femoral endarterectomy with profundoplasty and right femoral to above-knee popliteal  bypass.  The right leg is more symptomatic.  Likely staged intervention on the left leg at a later date.  Vein mapping ordered.  Will schedule as an outpatient.     Lonni DOROTHA Gaskins, MD [facility] [place] Office: [phone]

## 2024-01-19 ENCOUNTER — Other Ambulatory Visit: Payer: Self-pay

## 2024-01-19 ENCOUNTER — Encounter (HOSPITAL_COMMUNITY): Payer: Self-pay | Admitting: Vascular Surgery

## 2024-01-19 DIAGNOSIS — I70213 Atherosclerosis of native arteries of extremities with intermittent claudication, bilateral legs: Secondary | ICD-10-CM

## 2024-01-30 DIAGNOSIS — I10 Essential (primary) hypertension: Secondary | ICD-10-CM | POA: Diagnosis not present

## 2024-01-30 DIAGNOSIS — D751 Secondary polycythemia: Secondary | ICD-10-CM | POA: Diagnosis not present

## 2024-01-30 DIAGNOSIS — R5383 Other fatigue: Secondary | ICD-10-CM | POA: Diagnosis not present

## 2024-01-30 DIAGNOSIS — D7589 Other specified diseases of blood and blood-forming organs: Secondary | ICD-10-CM | POA: Diagnosis not present

## 2024-01-30 DIAGNOSIS — Z125 Encounter for screening for malignant neoplasm of prostate: Secondary | ICD-10-CM | POA: Diagnosis not present

## 2024-02-07 NOTE — Pre-Procedure Instructions (Signed)
 Surgical Instructions   Your procedure is scheduled on February 12, 2024. Report to [facility] Main Entrance A at 7:30 A.M., then check in with the Admitting office. Any questions or running late day of surgery: call [phone]  Questions prior to your surgery date: call [phone], Monday-Friday, 8am-4pm. If you experience any cold or flu symptoms such as cough, fever, chills, shortness of breath, etc. between now and your scheduled surgery, please notify us  at the above number.     Remember:  Do not eat or drink after midnight the night before your surgery   Take these medicines the morning of surgery with A SIP OF WATER : amLODipine  (NORVASC )  aspirin  EC  fluticasone  (FLONASE ) nasal spray    One week prior to surgery, STOP taking any Aspirin  (unless otherwise instructed by your surgeon) Aleve, Naproxen, Ibuprofen, Motrin, Advil, Goody's, BC's, all herbal medications, fish oil, and non-prescription vitamins.                     Do NOT Smoke (Tobacco/Vaping) for 24 hours prior to your procedure.  If you use a CPAP at night, you may bring your mask/headgear for your overnight stay.   You will be asked to remove any contacts, glasses, piercing's, hearing aid's, dentures/partials prior to surgery. Please bring cases for these items if needed.    Patients discharged the day of surgery will not be allowed to drive home, and someone needs to stay with them for 24 hours.  SURGICAL WAITING ROOM VISITATION Patients may have no more than 2 support people in the waiting area - these visitors may rotate.   Pre-op nurse will coordinate an appropriate time for 1 ADULT support person, who may not rotate, to accompany patient in pre-op.  Children under the age of 20 must have an adult with them who is not the patient and must remain in the main waiting area with an adult.  If the patient needs to stay at the hospital during part of their recovery, the visitor guidelines for inpatient rooms  apply.  Please refer to the [facility] website for the visitor guidelines for any additional information.   If you received a COVID test during your pre-op visit  it is requested that you wear a mask when out in public, stay away from anyone that may not be feeling well and notify your surgeon if you develop symptoms. If you have been in contact with anyone that has tested positive in the last 10 days please notify you surgeon.      Pre-operative CHG Bathing Instructions   You can play a key role in reducing the risk of infection after surgery. Your skin needs to be as free of germs as possible. You can reduce the number of germs on your skin by washing with CHG (chlorhexidine gluconate) soap before surgery. CHG is an antiseptic soap that kills germs and continues to kill germs even after washing.   DO NOT use if you have an allergy to chlorhexidine/CHG or antibacterial soaps. If your skin becomes reddened or irritated, stop using the CHG and notify one of our RNs at [phone].              TAKE A SHOWER THE NIGHT BEFORE SURGERY   Please keep in mind the following:  DO NOT shave, including legs and underarms, 48 hours prior to surgery.   You may shave your face before/day of surgery.  Place clean sheets on your bed the night before surgery  Use a clean washcloth (not used since being washed) for shower. DO NOT sleep with pet's night before surgery.  CHG Shower Instructions:  Wash your face and private area with normal soap. If you choose to wash your hair, wash first with your normal shampoo.  After you use shampoo/soap, rinse your hair and body thoroughly to remove shampoo/soap residue.  Turn the water  OFF and apply half the bottle of CHG soap to a CLEAN washcloth.  Apply CHG soap ONLY FROM YOUR NECK DOWN TO YOUR TOES (washing for 3-5 minutes)  DO NOT use CHG soap on face, private areas, open wounds, or sores.  Pay special attention to the area where your surgery is being  performed.  If you are having back surgery, having someone wash your back for you may be helpful. Wait 2 minutes after CHG soap is applied, then you may rinse off the CHG soap.  Pat dry with a clean towel  Put on clean pajamas    Additional instructions for the day of surgery: If you choose, you may shower the morning of surgery with an antibacterial soap.  DO NOT APPLY any lotions, deodorants, cologne, or perfumes.   Do not wear jewelry or makeup Do not wear nail polish, gel polish, artificial nails, or any other type of covering on natural nails (fingers and toes) Do not bring valuables to the hospital. [facility] is not responsible for valuables/personal belongings. Put on clean/comfortable clothes.  Please brush your teeth.  Ask your nurse before applying any prescription medications to the skin.

## 2024-02-08 ENCOUNTER — Encounter (HOSPITAL_COMMUNITY): Payer: Self-pay

## 2024-02-08 ENCOUNTER — Other Ambulatory Visit: Payer: Self-pay

## 2024-02-08 ENCOUNTER — Inpatient Hospital Stay (HOSPITAL_COMMUNITY): Admission: RE | Admit: 2024-02-08 | Discharge: 2024-02-08 | Attending: Vascular Surgery

## 2024-02-08 VITALS — BP 176/79 | HR 56 | Temp 98.7°F | Resp 18 | Ht 71.0 in | Wt 174.3 lb

## 2024-02-08 DIAGNOSIS — Z01818 Encounter for other preprocedural examination: Secondary | ICD-10-CM | POA: Diagnosis not present

## 2024-02-08 DIAGNOSIS — Z23 Encounter for immunization: Secondary | ICD-10-CM | POA: Diagnosis not present

## 2024-02-08 DIAGNOSIS — Z0000 Encounter for general adult medical examination without abnormal findings: Secondary | ICD-10-CM | POA: Diagnosis not present

## 2024-02-08 DIAGNOSIS — E785 Hyperlipidemia, unspecified: Secondary | ICD-10-CM | POA: Diagnosis not present

## 2024-02-08 DIAGNOSIS — F1010 Alcohol abuse, uncomplicated: Secondary | ICD-10-CM | POA: Diagnosis not present

## 2024-02-08 DIAGNOSIS — Z79899 Other long term (current) drug therapy: Secondary | ICD-10-CM | POA: Diagnosis not present

## 2024-02-08 DIAGNOSIS — Z8719 Personal history of other diseases of the digestive system: Secondary | ICD-10-CM | POA: Diagnosis not present

## 2024-02-08 DIAGNOSIS — D7589 Other specified diseases of blood and blood-forming organs: Secondary | ICD-10-CM | POA: Diagnosis not present

## 2024-02-08 DIAGNOSIS — I70213 Atherosclerosis of native arteries of extremities with intermittent claudication, bilateral legs: Secondary | ICD-10-CM

## 2024-02-08 DIAGNOSIS — R5383 Other fatigue: Secondary | ICD-10-CM | POA: Diagnosis not present

## 2024-02-08 DIAGNOSIS — I10 Essential (primary) hypertension: Secondary | ICD-10-CM | POA: Diagnosis not present

## 2024-02-08 DIAGNOSIS — Z87891 Personal history of nicotine dependence: Secondary | ICD-10-CM | POA: Diagnosis not present

## 2024-02-08 DIAGNOSIS — R9431 Abnormal electrocardiogram [ECG] [EKG]: Secondary | ICD-10-CM | POA: Diagnosis not present

## 2024-02-08 DIAGNOSIS — Z8711 Personal history of peptic ulcer disease: Secondary | ICD-10-CM | POA: Diagnosis not present

## 2024-02-08 DIAGNOSIS — E782 Mixed hyperlipidemia: Secondary | ICD-10-CM | POA: Diagnosis not present

## 2024-02-08 HISTORY — DX: Personal history of urinary calculi: Z87.442

## 2024-02-08 LAB — COMPREHENSIVE METABOLIC PANEL WITH GFR
ALT: 37 U/L (ref 0–44)
AST: 33 U/L (ref 15–41)
Albumin: 4.3 g/dL (ref 3.5–5.0)
Alkaline Phosphatase: 77 U/L (ref 38–126)
Anion gap: 7 (ref 5–15)
BUN: 13 mg/dL (ref 8–23)
CO2: 30 mmol/L (ref 22–32)
Calcium: 9.5 mg/dL (ref 8.9–10.3)
Chloride: 103 mmol/L (ref 98–111)
Creatinine, Ser: 0.96 mg/dL (ref 0.61–1.24)
GFR, Estimated: >60 mL/min (ref >60)
Glucose, Bld: 146 mg/dL — ABNORMAL HIGH (ref 70–99)
Potassium: 3.4 mmol/L — ABNORMAL LOW (ref 3.5–5.1)
Sodium: 140 mmol/L (ref 135–145)
Total Bilirubin: 0.8 mg/dL (ref 0.0–1.2)
Total Protein: 7.9 g/dL (ref 6.5–8.1)

## 2024-02-08 LAB — URINALYSIS, ROUTINE W REFLEX MICROSCOPIC
Bilirubin Urine: NEGATIVE
Glucose, UA: 100 mg/dL — AB
Ketones, ur: NEGATIVE mg/dL
Leukocytes,Ua: NEGATIVE
Nitrite: NEGATIVE
Protein, ur: NEGATIVE mg/dL
Specific Gravity, Urine: >1.03 — ABNORMAL HIGH (ref 1.005–1.030)
pH: 6 (ref 5.0–8.0)

## 2024-02-08 LAB — CBC
HCT: 39.6 % (ref 39.0–52.0)
Hemoglobin: 13.3 g/dL (ref 13.0–17.0)
MCH: 34 pg (ref 26.0–34.0)
MCHC: 33.6 g/dL (ref 30.0–36.0)
MCV: 101.3 fL — ABNORMAL HIGH (ref 80.0–100.0)
Platelets: 247 K/uL (ref 150–400)
RBC: 3.91 MIL/uL — ABNORMAL LOW (ref 4.22–5.81)
RDW: 12.3 % (ref 11.5–15.5)
WBC: 8.2 K/uL (ref 4.0–10.5)
nRBC: 0 % (ref 0.0–0.2)

## 2024-02-08 LAB — SURGICAL PCR SCREEN
MRSA, PCR: NEGATIVE
Staphylococcus aureus: NEGATIVE

## 2024-02-08 LAB — TYPE AND SCREEN
ABO/RH(D): AB POS
Antibody Screen: NEGATIVE

## 2024-02-08 LAB — APTT: aPTT: 31 s (ref 24–36)

## 2024-02-08 LAB — PROTIME-INR
INR: 1 (ref 0.8–1.2)
Prothrombin Time: 13.3 s (ref 11.4–15.2)

## 2024-02-08 LAB — URINALYSIS, MICROSCOPIC (REFLEX)
Bacteria, UA: NONE SEEN
RBC / HPF: NONE SEEN RBC/hpf (ref 0–5)
Squamous Epithelial / HPF: NONE SEEN /HPF (ref 0–5)

## 2024-02-08 NOTE — Progress Notes (Signed)
 PCP - Gary Harrison COME Cardiologist - Gary Harrison- LOV 10/23/23  PPM/ICD - denies Device Orders -  Rep Notified -   Chest x-ray - denies EKG - 02/08/24 Stress Test - 10/31/23 ECHO - denies Cardiac Cath - denies  Sleep Study - denies CPAP -   Fasting Blood Sugar - na Checks Blood Sugar _____ times a day  Last dose of GLP1 agonist- na  GLP1 instructions:   Blood Thinner Instructions:na Aspirin  Instructions:continue  ERAS Protcol -NPO PRE-SURGERY Ensure or G2-   COVID TEST- na   Anesthesia review: yes- abnormal EKG,recent stress test  Patient denies shortness of breath, fever, cough and chest pain at PAT appointment   All instructions explained to the patient, with a verbal understanding of the material. Patient agrees to go over the instructions while at home for a better understanding. The opportunity to ask questions was provided.

## 2024-02-09 NOTE — Progress Notes (Signed)
 Anesthesia Chart Review:  Case: [id number] Date/Time: 02/12/24 0915   Procedures:      ENDARTERECTOMY, FEMORAL (Right)     BYPASS GRAFT FEMORAL-POPLITEAL ARTERY (Right)   Anesthesia type: General   Diagnosis: Atherosclerosis of native artery of both lower extremities with intermittent claudication [I70.213]   Pre-op diagnosis: ATHEROSCLEROSIS BLE   [facility] RO[facility] 16 / [facility] OR   Surgeons: Gretta Lonni PARAS, MD       DISCUSSION: Patient is a 68 year old male scheduled for the above procedure.  History includes smoking, HTN, PAD, impaired fasting glucose, gastric/duodenal ulcers (+ H. Pylori 2014).   He had cardiology evaluation by Dr. Mona on 10/23/2023 for dyslipidemia, dyspnea, claudication. Chest CT in 2019 showed moderate 3V coronary calcifications. HLD managed on statin therapy. 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 No frank chest pain. He noted patient already undergoing vascular surgery evaluation for PAD and my be considered for LE bypass in the future. A nuclear stress test was recommended and done on 10/30/2024. Finding felt consistent with prior inferior infarct (although may be artifact). There was minimal peri-infarct ischemia, EF 66%. Dr. Mona felt the study was low risk. No additional cardiac testing ordered.   He has since undergone aortogram with BLE run-off. Per Dr. Gretta, The patient will need right femoral endarterectomy with profundoplasty and right femoral to above-knee popliteal bypass. The right leg is more symptomatic. Likely staged intervention on the left leg at a later date.   Anesthesia team to evaluate on the day of surgery.    VS: BP (!) 176/79   Pulse (!) 56   Temp 37.1 C   Resp 18   Ht 5' 11 (1.803 m)   Wt 79.1 kg   SpO2 99%   BMI 24.31 kg/m    PROVIDERS: Verdia Lombard, MD is PCP    LABS: Labs reviewed: Acceptable for surgery. (all labs ordered are listed, but only abnormal results are  displayed)  Labs Reviewed  CBC - Abnormal; Notable for the following components:      Result Value   RBC 3.91 (*)    MCV 101.3 (*)    All other components within normal limits  COMPREHENSIVE METABOLIC PANEL WITH GFR - Abnormal; Notable for the following components:   Potassium 3.4 (*)    Glucose, Bld 146 (*)    All other components within normal limits  URINALYSIS, ROUTINE W REFLEX MICROSCOPIC - Abnormal; Notable for the following components:   Specific Gravity, Urine >1.030 (*)    Glucose, UA 100 (*)    Hgb urine dipstick TRACE (*)    All other components within normal limits  SURGICAL PCR SCREEN  PROTIME-INR  APTT  URINALYSIS, MICROSCOPIC (REFLEX)  TYPE AND SCREEN     EKG: 02/08/2024: Sinus bradycardia at 58 bpm  Nonspecific ST and T wave abnormality   CV: Nuclear stress test 10/31/2023:   Findings are consistent with prior inferior infarction with minimal peri-infarct ischemia. The study is low risk.   LV perfusion is abnormal. Large moderate intensity inferior wall defect with minimal reversibility.   Left ventricular function is normal. Nuclear stress EF: 66%. The left ventricular ejection fraction is normal (55-65%). End diastolic cavity size is normal. - Dr. Mona reviewed and wrote, Stress test was negative for ischemia - there is a large inferior wall defect which was thought to be scar, but may be artifact, in light of normal LVEF and wall motion. Either way, I agree it is a low  risk study.   Past Medical History:  Diagnosis Date   ED (erectile dysfunction)    H pylori ulcer    2014, multiple gastric and duodenal ulcers due to H. pylori.  Treated with amoxicillin , Biaxin , PPI   History of kidney stones    Hypertension    no meds currently   Impaired fasting glucose    Peripheral vascular disease     Past Surgical History:  Procedure Laterality Date   ABDOMINAL AORTOGRAM W/LOWER EXTREMITY N/A 01/18/2024   Procedure: ABDOMINAL AORTOGRAM W/LOWER EXTREMITY;   Surgeon: Gretta Lonni PARAS, MD;  Location: [facility] INVASIVE CV LAB;  Service: Cardiovascular;  Laterality: N/A;   COLONOSCOPY  10/18/2007     DOQ:Uymzz descending colon polyps.  The polyps ranged from 8 mm  to 1.2 cm.  One pedunculated sigmoid colon polyp removed via snare cautery; others via cold forceps. One 3-mm cecal polyp removed via cold forceps.  Otherwise, no masses, inflammatory changes, diverticular, or arteriovenous malformations seen. simple adenomas. Needs surveillance 2014   COLONOSCOPY WITH ESOPHAGOGASTRODUODENOSCOPY (EGD) N/A 01/25/2013   Dr. harvey: Multiple small ulcers seen in the gastric antrum as well as the duodenum.  Biopsies showed H. pylori gastritis.  Need to come back for 86-month follow-up EGD but he did not have this done.  Colonoscopy showed small internal hemorrhoids, 8 colon polyps removed only one was a simple adenoma.  Next colonoscopy in 10 years.   COLONOSCOPY WITH PROPOFOL  N/A 05/02/2023   Procedure: COLONOSCOPY WITH PROPOFOL ;  Surgeon: Cindie Carlin POUR, DO;  Location: [facility] ENDO SUITE;  Service: Endoscopy;  Laterality: N/A;  10:30am, asa 2   LOWER EXTREMITY ANGIOGRAPHY N/A 01/18/2024   Procedure: Lower Extremity Angiography;  Surgeon: Gretta Lonni PARAS, MD;  Location: [facility] INVASIVE CV LAB;  Service: Cardiovascular;  Laterality: N/A;   POLYPECTOMY  05/02/2023   Procedure: POLYPECTOMY;  Surgeon: Cindie Carlin POUR, DO;  Location: [facility] ENDO SUITE;  Service: Endoscopy;;    MEDICATIONS:  amLODipine  (NORVASC ) 10 MG tablet   aspirin  EC 81 MG tablet   cilostazol  (PLETAL ) 100 MG tablet   fluticasone  (FLONASE ) 50 MCG/ACT nasal spray   olmesartan (BENICAR) 40 MG tablet   rosuvastatin (CRESTOR) 20 MG tablet   No current facility-administered medications for this encounter.   Pletal  is listed as no currently taking. He will continue ASA perioepratively.   Isaiah Ruder, PA-C Surgical Short Stay/Anesthesiology [facility] Phone [phone] [facility] Phone [phone] 02/09/2024  11:27 AM

## 2024-02-09 NOTE — Anesthesia Preprocedure Evaluation (Addendum)
 Anesthesia Evaluation  Patient identified by MRN, date of birth, ID band Patient awake    Reviewed: Allergy & Precautions, NPO status , Patient's Chart, lab work & pertinent test results  Airway Mallampati: II  TM Distance: >3 FB Neck ROM: Full    Dental no notable dental hx.    Pulmonary Current Smoker and Patient abstained from smoking.   Pulmonary exam normal        Cardiovascular hypertension, Pt. on medications + Peripheral Vascular Disease   Rhythm:Regular Rate:Normal  CV: Nuclear stress test 10/31/2023:   Findings are consistent with prior inferior infarction with minimal peri-infarct ischemia. The study is low risk.   LV perfusion is abnormal. Large moderate intensity inferior wall defect with minimal reversibility.   Left ventricular function is normal. Nuclear stress EF: 66%. The left ventricular ejection fraction is normal (55-65%). End diastolic cavity size is normal. - Dr. Mona reviewed and wrote, Stress test was negative for ischemia - there is a large inferior wall defect which was thought to be scar, but may be artifact, in light of normal LVEF and wall motion. Either way, I agree it is a low risk study.    Neuro/Psych negative neurological ROS  negative psych ROS   GI/Hepatic Neg liver ROS, PUD,,,  Endo/Other  negative endocrine ROS    Renal/GU negative Renal ROS  negative genitourinary   Musculoskeletal negative musculoskeletal ROS (+)    Abdominal   Peds  Hematology Lab Results      Component                Value               Date                      WBC                      8.2                 02/08/2024                HGB                      13.3                02/08/2024                HCT                      39.6                02/08/2024                MCV                      101.3 (H)           02/08/2024                PLT                      247                 02/08/2024              Lab Results      Component                Value  Date                      NA                       140                 02/08/2024                K                        3.4 (L)             02/08/2024                CO2                      30                  02/08/2024                GLUCOSE                  146 (H)             02/08/2024                BUN                      13                  02/08/2024                CREATININE               0.96                02/08/2024                CALCIUM                   9.5                 02/08/2024                GFRNONAA                 >60                 02/08/2024              Anesthesia Other Findings   Reproductive/Obstetrics                              Anesthesia Physical Anesthesia Plan  ASA: 3  Anesthesia Plan: General   Post-op Pain Management:    Induction: Intravenous  PONV Risk Score and Plan: 1 and Ondansetron , Dexamethasone , Treatment may vary due to age or medical condition and Midazolam   Airway Management Planned: Mask and Oral ETT  Additional Equipment: Arterial line  Intra-op Plan:   Post-operative Plan: Extubation in OR  Informed Consent: I have reviewed the patients History and Physical, chart, labs and discussed the procedure including the risks, benefits and alternatives for the proposed anesthesia with the patient or authorized representative who has indicated his/her understanding and acceptance.     Dental advisory given  Plan Discussed with: CRNA  Anesthesia Plan Comments: (PAT note written 02/09/2024 by Isaiah  Zelenak, PA-C.  )         Anesthesia Quick Evaluation

## 2024-02-12 ENCOUNTER — Inpatient Hospital Stay (HOSPITAL_COMMUNITY): Admitting: Anesthesiology

## 2024-02-12 ENCOUNTER — Inpatient Hospital Stay (HOSPITAL_COMMUNITY)
Admission: RE | Admit: 2024-02-12 | Discharge: 2024-02-14 | DRG: 263 | Disposition: A | Discharge status: Home / Self Care | Attending: Vascular Surgery | Admitting: Vascular Surgery

## 2024-02-12 ENCOUNTER — Inpatient Hospital Stay (HOSPITAL_COMMUNITY)

## 2024-02-12 ENCOUNTER — Encounter (HOSPITAL_COMMUNITY): Admission: RE | Disposition: A | Payer: Self-pay | Source: Home / Self Care | Attending: Vascular Surgery

## 2024-02-12 ENCOUNTER — Encounter (HOSPITAL_COMMUNITY): Payer: Self-pay | Admitting: Vascular Surgery

## 2024-02-12 ENCOUNTER — Other Ambulatory Visit: Payer: Self-pay

## 2024-02-12 ENCOUNTER — Inpatient Hospital Stay (HOSPITAL_COMMUNITY): Payer: Self-pay | Admitting: Vascular Surgery

## 2024-02-12 DIAGNOSIS — I70211 Atherosclerosis of native arteries of extremities with intermittent claudication, right leg: Secondary | ICD-10-CM

## 2024-02-12 DIAGNOSIS — D62 Acute posthemorrhagic anemia: Secondary | ICD-10-CM | POA: Diagnosis present

## 2024-02-12 DIAGNOSIS — Z95828 Presence of other vascular implants and grafts: Principal | ICD-10-CM

## 2024-02-12 DIAGNOSIS — I10 Essential (primary) hypertension: Secondary | ICD-10-CM | POA: Diagnosis present

## 2024-02-12 DIAGNOSIS — F17210 Nicotine dependence, cigarettes, uncomplicated: Secondary | ICD-10-CM | POA: Diagnosis present

## 2024-02-12 DIAGNOSIS — Z01818 Encounter for other preprocedural examination: Secondary | ICD-10-CM

## 2024-02-12 DIAGNOSIS — I739 Peripheral vascular disease, unspecified: Secondary | ICD-10-CM | POA: Diagnosis present

## 2024-02-12 DIAGNOSIS — Z809 Family history of malignant neoplasm, unspecified: Secondary | ICD-10-CM | POA: Diagnosis not present

## 2024-02-12 DIAGNOSIS — Z7902 Long term (current) use of antithrombotics/antiplatelets: Secondary | ICD-10-CM | POA: Diagnosis not present

## 2024-02-12 DIAGNOSIS — Z7982 Long term (current) use of aspirin: Secondary | ICD-10-CM

## 2024-02-12 DIAGNOSIS — Z860101 Personal history of adenomatous and serrated colon polyps: Secondary | ICD-10-CM | POA: Diagnosis not present

## 2024-02-12 DIAGNOSIS — I70213 Atherosclerosis of native arteries of extremities with intermittent claudication, bilateral legs: Principal | ICD-10-CM | POA: Diagnosis present

## 2024-02-12 HISTORY — PX: ULTRASOUND GUIDANCE FOR VASCULAR ACCESS: SHX6516

## 2024-02-12 HISTORY — PX: ENDARTERECTOMY FEMORAL: SHX5804

## 2024-02-12 LAB — CREATININE, SERUM
Creatinine, Ser: 0.9 mg/dL (ref 0.61–1.24)
GFR, Estimated: >60 mL/min (ref >60)

## 2024-02-12 LAB — CBC
HCT: 30.5 % — ABNORMAL LOW (ref 39.0–52.0)
Hemoglobin: 10.7 g/dL — ABNORMAL LOW (ref 13.0–17.0)
MCH: 34.2 pg — ABNORMAL HIGH (ref 26.0–34.0)
MCHC: 35.1 g/dL (ref 30.0–36.0)
MCV: 97.4 fL (ref 80.0–100.0)
Platelets: 250 K/uL (ref 150–400)
RBC: 3.13 MIL/uL — ABNORMAL LOW (ref 4.22–5.81)
RDW: 12 % (ref 11.5–15.5)
WBC: 14.2 K/uL — ABNORMAL HIGH (ref 4.0–10.5)
nRBC: 0 % (ref 0.0–0.2)

## 2024-02-12 LAB — ABO/RH: ABO/RH(D): AB POS

## 2024-02-12 LAB — POCT ACTIVATED CLOTTING TIME
Activated Clotting Time: 256 s
Activated Clotting Time: 220 s

## 2024-02-12 MED ORDER — PHENOL 1.4 % MT LIQD: 1.0000 | OROMUCOSAL | Status: DC | PRN

## 2024-02-12 MED ORDER — ASPIRIN 81 MG PO TBEC
81.0000 mg | DELAYED_RELEASE_TABLET | Freq: Every day | ORAL | Status: DC
  Administered 2024-02-13 – 2024-02-14 (×2): 81 mg via ORAL
  Filled 2024-02-12 (×2): qty 1

## 2024-02-12 MED ORDER — FENTANYL CITRATE (PF) 250 MCG/5ML IJ SOLN
INTRAMUSCULAR | Status: DC | PRN
  Administered 2024-02-12: 100 ug via INTRAVENOUS
  Administered 2024-02-12: 50 ug via INTRAVENOUS
  Administered 2024-02-12: 100 ug via INTRAVENOUS

## 2024-02-12 MED ORDER — PHENYLEPHRINE 80 MCG/ML (10ML) SYRINGE FOR IV PUSH (FOR BLOOD PRESSURE SUPPORT)
PREFILLED_SYRINGE | INTRAVENOUS | Status: DC | PRN
  Administered 2024-02-12: 160 ug via INTRAVENOUS

## 2024-02-12 MED ORDER — LACTATED RINGERS IV SOLN: INTRAVENOUS | Status: DC | PRN

## 2024-02-12 MED ORDER — OXYCODONE HCL 5 MG PO TABS
5.0000 mg | ORAL_TABLET | ORAL | Status: DC | PRN
  Administered 2024-02-13 (×4): 10 mg via ORAL
  Administered 2024-02-14: 5 mg via ORAL
  Filled 2024-02-12 (×4): qty 2
  Filled 2024-02-12: qty 1

## 2024-02-12 MED ORDER — CHLORHEXIDINE GLUCONATE 0.12 % MT SOLN
15.0000 mL | Freq: Once | OROMUCOSAL | Status: AC
  Administered 2024-02-12: 15 mL via OROMUCOSAL
  Filled 2024-02-12: qty 15

## 2024-02-12 MED ORDER — MORPHINE SULFATE (PF) 2 MG/ML IV SOLN
2.0000 mg | INTRAVENOUS | Status: DC | PRN
  Administered 2024-02-12 – 2024-02-13 (×5): 2 mg via INTRAVENOUS
  Filled 2024-02-12 (×5): qty 1

## 2024-02-12 MED ORDER — LIDOCAINE 2% (20 MG/ML) 5 ML SYRINGE
INTRAMUSCULAR | Status: DC | PRN
  Administered 2024-02-12: 60 mg via INTRAVENOUS

## 2024-02-12 MED ORDER — CEFAZOLIN SODIUM-DEXTROSE 2-4 GM/100ML-% IV SOLN
2.0000 g | INTRAVENOUS | Status: AC
  Administered 2024-02-12: 2 g via INTRAVENOUS
  Filled 2024-02-12: qty 100

## 2024-02-12 MED ORDER — DROPERIDOL 2.5 MG/ML IJ SOLN: 0.6250 mg | Freq: Once | INTRAMUSCULAR | Status: DC | PRN

## 2024-02-12 MED ORDER — FENTANYL CITRATE (PF) 100 MCG/2ML IJ SOLN: 25.0000 ug | INTRAMUSCULAR | Status: DC | PRN

## 2024-02-12 MED ORDER — ACETAMINOPHEN 650 MG RE SUPP: 325.0000 mg | RECTAL | Status: DC | PRN

## 2024-02-12 MED ORDER — DEXAMETHASONE SOD PHOSPHATE PF 10 MG/ML IJ SOLN
INTRAMUSCULAR | Status: DC | PRN
  Administered 2024-02-12: 10 mg via INTRAVENOUS

## 2024-02-12 MED ORDER — HEPARIN 6000 UNIT IRRIGATION SOLUTION
Status: DC | PRN
  Administered 2024-02-12: 1

## 2024-02-12 MED ORDER — METOPROLOL TARTRATE 5 MG/5ML IV SOLN: 2.5000 mg | INTRAVENOUS | Status: DC | PRN

## 2024-02-12 MED ORDER — LABETALOL HCL 5 MG/ML IV SOLN: 10.0000 mg | INTRAVENOUS | Status: DC | PRN

## 2024-02-12 MED ORDER — ONDANSETRON HCL 4 MG/2ML IJ SOLN: 4.0000 mg | Freq: Four times a day (QID) | INTRAMUSCULAR | Status: DC | PRN

## 2024-02-12 MED ORDER — HEPARIN SODIUM (PORCINE) 5000 UNIT/ML IJ SOLN
5000.0000 [IU] | Freq: Three times a day (TID) | INTRAMUSCULAR | Status: DC
  Administered 2024-02-13 – 2024-02-14 (×4): 5000 [IU] via SUBCUTANEOUS
  Filled 2024-02-12 (×4): qty 1

## 2024-02-12 MED ORDER — SODIUM CHLORIDE 0.9 % IV SOLN: INTRAVENOUS | Status: DC

## 2024-02-12 MED ORDER — BISACODYL 5 MG PO TBEC: 5.0000 mg | DELAYED_RELEASE_TABLET | Freq: Every day | ORAL | Status: DC | PRN

## 2024-02-12 MED ORDER — PHENYLEPHRINE HCL-NACL 20-0.9 MG/250ML-% IV SOLN
INTRAVENOUS | Status: DC | PRN
  Administered 2024-02-12: 30 ug/min via INTRAVENOUS

## 2024-02-12 MED ORDER — POTASSIUM CHLORIDE CRYS ER 20 MEQ PO TBCR: 40.0000 meq | EXTENDED_RELEASE_TABLET | Freq: Every day | ORAL | Status: DC | PRN

## 2024-02-12 MED ORDER — DIPHENHYDRAMINE HCL 50 MG/ML IJ SOLN
INTRAMUSCULAR | Status: AC
  Filled 2024-02-12: qty 1

## 2024-02-12 MED ORDER — 0.9 % SODIUM CHLORIDE (POUR BTL) OPTIME
TOPICAL | Status: DC | PRN
  Administered 2024-02-12: 2000 mL

## 2024-02-12 MED ORDER — MIDAZOLAM HCL (PF) 2 MG/2ML IJ SOLN
INTRAMUSCULAR | Status: DC | PRN
  Administered 2024-02-12: 2 mg via INTRAVENOUS

## 2024-02-12 MED ORDER — ROCURONIUM BROMIDE 10 MG/ML (PF) SYRINGE
PREFILLED_SYRINGE | INTRAVENOUS | Status: DC | PRN
  Administered 2024-02-12: 60 mg via INTRAVENOUS
  Administered 2024-02-12 (×2): 20 mg via INTRAVENOUS

## 2024-02-12 MED ORDER — CLOPIDOGREL BISULFATE 75 MG PO TABS
75.0000 mg | ORAL_TABLET | Freq: Every day | ORAL | Status: DC
  Administered 2024-02-13 – 2024-02-14 (×2): 75 mg via ORAL
  Filled 2024-02-12 (×2): qty 1

## 2024-02-12 MED ORDER — IRBESARTAN 150 MG PO TABS
150.0000 mg | ORAL_TABLET | Freq: Every day | ORAL | Status: DC
  Administered 2024-02-12 – 2024-02-14 (×3): 150 mg via ORAL
  Filled 2024-02-12 (×3): qty 1

## 2024-02-12 MED ORDER — DEXMEDETOMIDINE HCL IN NACL 80 MCG/20ML IV SOLN
INTRAVENOUS | Status: DC | PRN
  Administered 2024-02-12 (×2): 10 ug via INTRAVENOUS

## 2024-02-12 MED ORDER — ONDANSETRON HCL 4 MG/2ML IJ SOLN
INTRAMUSCULAR | Status: AC
  Filled 2024-02-12: qty 2

## 2024-02-12 MED ORDER — ROSUVASTATIN CALCIUM 20 MG PO TABS
20.0000 mg | ORAL_TABLET | Freq: Every day | ORAL | Status: DC
  Administered 2024-02-12 – 2024-02-13 (×2): 20 mg via ORAL
  Filled 2024-02-12 (×2): qty 1

## 2024-02-12 MED ORDER — CEFAZOLIN SODIUM-DEXTROSE 2-4 GM/100ML-% IV SOLN
2.0000 g | Freq: Three times a day (TID) | INTRAVENOUS | Status: AC
  Administered 2024-02-12 – 2024-02-13 (×2): 2 g via INTRAVENOUS
  Filled 2024-02-12 (×2): qty 100

## 2024-02-12 MED ORDER — LIDOCAINE 2% (20 MG/ML) 5 ML SYRINGE
INTRAMUSCULAR | Status: AC
  Filled 2024-02-12: qty 5

## 2024-02-12 MED ORDER — CHLORHEXIDINE GLUCONATE CLOTH 2 % EX PADS: 6.0000 | MEDICATED_PAD | Freq: Once | CUTANEOUS | Status: DC

## 2024-02-12 MED ORDER — AMLODIPINE BESYLATE 10 MG PO TABS
10.0000 mg | ORAL_TABLET | Freq: Every day | ORAL | Status: DC
  Administered 2024-02-13 – 2024-02-14 (×2): 10 mg via ORAL
  Filled 2024-02-12 (×2): qty 1

## 2024-02-12 MED ORDER — HEPARIN SODIUM (PORCINE) 1000 UNIT/ML IJ SOLN
INTRAMUSCULAR | Status: DC | PRN
  Administered 2024-02-12: 8000 [IU] via INTRAVENOUS
  Administered 2024-02-12: 2000 [IU] via INTRAVENOUS

## 2024-02-12 MED ORDER — DOCUSATE SODIUM 100 MG PO CAPS
100.0000 mg | ORAL_CAPSULE | Freq: Every day | ORAL | Status: DC
  Administered 2024-02-13 – 2024-02-14 (×2): 100 mg via ORAL
  Filled 2024-02-12 (×2): qty 1

## 2024-02-12 MED ORDER — PROPOFOL 10 MG/ML IV BOLUS
INTRAVENOUS | Status: AC
  Filled 2024-02-12: qty 20

## 2024-02-12 MED ORDER — PHENYLEPHRINE 80 MCG/ML (10ML) SYRINGE FOR IV PUSH (FOR BLOOD PRESSURE SUPPORT)
PREFILLED_SYRINGE | INTRAVENOUS | Status: AC
  Filled 2024-02-12: qty 10

## 2024-02-12 MED ORDER — PROTAMINE SULFATE 10 MG/ML IV SOLN
INTRAVENOUS | Status: DC | PRN
  Administered 2024-02-12: 50 mg via INTRAVENOUS

## 2024-02-12 MED ORDER — IODIXANOL 320 MG/ML IV SOLN
INTRAVENOUS | Status: DC | PRN
  Administered 2024-02-12: 24 mL via INTRA_ARTERIAL

## 2024-02-12 MED ORDER — ROCURONIUM BROMIDE 10 MG/ML (PF) SYRINGE
PREFILLED_SYRINGE | INTRAVENOUS | Status: AC
  Filled 2024-02-12: qty 10

## 2024-02-12 MED ORDER — POLYETHYLENE GLYCOL 3350 17 G PO PACK: 17.0000 g | PACK | Freq: Every day | ORAL | Status: DC | PRN

## 2024-02-12 MED ORDER — EPHEDRINE SULFATE-NACL 50-0.9 MG/10ML-% IV SOSY
PREFILLED_SYRINGE | INTRAVENOUS | Status: DC | PRN
  Administered 2024-02-12: 10 mg via INTRAVENOUS

## 2024-02-12 MED ORDER — HEPARIN 6000 UNIT IRRIGATION SOLUTION
Status: AC
  Filled 2024-02-12: qty 500

## 2024-02-12 MED ORDER — PROPOFOL 10 MG/ML IV BOLUS
INTRAVENOUS | Status: DC | PRN
  Administered 2024-02-12: 170 mg via INTRAVENOUS

## 2024-02-12 MED ORDER — MIDAZOLAM HCL 2 MG/2ML IJ SOLN
INTRAMUSCULAR | Status: AC
  Filled 2024-02-12: qty 2

## 2024-02-12 MED ORDER — SODIUM CHLORIDE 0.9 % IV SOLN: 500.0000 mL | Freq: Once | INTRAVENOUS | Status: DC | PRN

## 2024-02-12 MED ORDER — HYDRALAZINE HCL 20 MG/ML IJ SOLN: 5.0000 mg | INTRAMUSCULAR | Status: DC | PRN

## 2024-02-12 MED ORDER — LACTATED RINGERS IV SOLN: INTRAVENOUS | Status: DC

## 2024-02-12 MED ORDER — EPHEDRINE 5 MG/ML INJ
INTRAVENOUS | Status: AC
  Filled 2024-02-12: qty 5

## 2024-02-12 MED ORDER — HEMOSTATIC AGENTS (NO CHARGE) OPTIME
TOPICAL | Status: DC | PRN
  Administered 2024-02-12: 1 via TOPICAL

## 2024-02-12 MED ORDER — ONDANSETRON HCL 4 MG/2ML IJ SOLN
INTRAMUSCULAR | Status: DC | PRN
  Administered 2024-02-12: 4 mg via INTRAVENOUS

## 2024-02-12 MED ORDER — SUGAMMADEX SODIUM 200 MG/2ML IV SOLN
INTRAVENOUS | Status: DC | PRN
  Administered 2024-02-12: 200 mg via INTRAVENOUS

## 2024-02-12 MED ORDER — DIPHENHYDRAMINE HCL 50 MG/ML IJ SOLN
INTRAMUSCULAR | Status: DC | PRN
  Administered 2024-02-12: 25 mg via INTRAVENOUS

## 2024-02-12 MED ORDER — ACETAMINOPHEN 325 MG PO TABS: 325.0000 mg | ORAL_TABLET | ORAL | Status: DC | PRN

## 2024-02-12 MED ORDER — FENTANYL CITRATE (PF) 250 MCG/5ML IJ SOLN
INTRAMUSCULAR | Status: AC
  Filled 2024-02-12: qty 5

## 2024-02-12 MED ORDER — SODIUM CHLORIDE 0.9 % IV SOLN: INTRAVENOUS | Status: AC

## 2024-02-12 MED ORDER — ACETAMINOPHEN 500 MG PO TABS
1000.0000 mg | ORAL_TABLET | Freq: Once | ORAL | Status: AC
  Administered 2024-02-12: 1000 mg via ORAL
  Filled 2024-02-12: qty 2

## 2024-02-12 MED ORDER — ORAL CARE MOUTH RINSE: 15.0000 mL | Freq: Once | OROMUCOSAL | Status: AC

## 2024-02-12 NOTE — Op Note (Signed)
 Date: February 12, 2024  Preoperative diagnosis: Disabling right lower extremity claudication  Postoperative diagnosis: Same  Procedure: 1.  Harvest of right leg great saphenous vein 2.  Right common femoral endarterectomy including profundoplasty and endarterectomy of the proximal SFA with vein patch angioplasty 3.  Right lower extremity angiogram 4.  Right SFA angioplasty with stent placement (6 mm x 80 mm drug-coated Wiley)  Surgeon: Dr. Lonni DOROTHA Gaskins, MD  Assistant: Ahmed Holster, PA  Indications: 68 year old male seen with disabling claudication of the right lower extremity.  He underwent angiogram showing occluded distal common femoral with high-grade versus subtotal occlusion of the profunda and occluded proximal SFA.  He presents for right lower extremity angiogram with possible bypass after risk-benefits discussed.  Assistant was needed given the complexity the case and also for the endarterectomy and vein harvest.  Findings: Extensive endarterectomy was performed from the common femoral onto the two main profunda branches and also endarterectomy of the proximal SFA.  A vein patch angioplasty was performed with GSV.  I was not happy with signals in the foot so I shot a right lower extremity angiogram and antegrade puncture of the patch.  There was a high-grade stenosis remnant just distal to her SFA endarterectomy that was stented with a 6 mm x 80 mm Eluvia postdilated with a 5 mm Mustang.  Widely patent stent at completion with widely patent femoral endarterectomy and profunda vessels.  Anesthesia: General  Details: Patient was taken to the operating room after informed consent was obtained.  Placed on operative table supine position.  General endotracheal anesthesia was induced.  Ultimately the bilateral groins and right leg were prepped and draped in standard sterile fashion.  Antibiotics were given and timeout performed.  Initially started in the right groin with a  horizontal incision above inguinal crease.  Dissected down through  subcutaneous tissue with Bovie cautery and used cerebellum retractors.  Opened the femoral sheath in longitudinal fashion.  We dissected pretty long way out on the SFA and profunda and controlled these with Vesseloops.  The common femoral was dissected out as well as the external iliac and we got a nice place for a clamp proximally.  We went ahead and dissected out the great saphenous vein that was in the surgical bed and dissected this out for about 6 to 8 cm and this was ligated overriding all clamps for vein patch.  The patient was given 100 units/kg IV heparin .  ACT was checked to maintain greater than 250.  I used Vesseloops on the SFA and a baby profunda on the profunda.  The external iliac was controlled with the henley clamp.  Endarterectomy was performed of the common femoral artery onto the main profunda branch.  Performed eversion endarterectomy of the second occluded profunda branch and got good backbleeding.  Extended the arteriotomy onto the proximal SFA and we performed extensive endarterectomy of the SFA where it was chronically occluded.  I then brought the vein on the field this was spatulated and we did a vein patch angioplasty with help of my assistant using 5-0 Prolene parachute technique to the right common femoral artery onto the main profunda branch and proximal SFA with the help of my assistant.  This was de-aired prior to completion.  I had to put some additional repair sutures.  Ultimately we had a good signal in the profunda but an occlusive signal in the SFA.  I really was not happy with the signal in the foot.  I then stuck the  patch antegrade and got a right lower extremity angiogram with a micro catheter showing a high-grade stenosis in the SFA just beyond our SFA endarterectomy and there was limited flow past this point.  I then advanced a Glidewire advantage and upsized to a 6 French catapult sheath.  I was able to  get a wire across the SFA high grade stenosis.  We elected to stent this with a 6 mm x 80 mm drug-coated Eluvia in the SFA and then dilated with a 5 mm Mustang.  Widely patent stent at completion with widely patent femoral endarterectomy and profunda runoff.  Wires and catheters were removed.  We had good signals in the foot.  Protamine  was given for reversal.  The groin was irrigated out and closed in multiple layers with 2-0 Vicryl, 3-0 Vicryl, 4-0 Monocryl and Dermabond.  Complication: None  Condition: Stable  Lonni DOROTHA Gaskins, MD [facility] Office: [phone]   Lonni JINNY Gaskins

## 2024-02-12 NOTE — Anesthesia Procedure Notes (Signed)
 Arterial Line Insertion Start/End12/10/2023 10:05 AM, 02/12/2024 10:07 AM Performed by: Dorethea Cordella SQUIBB, DO, Mikaylah Libbey L, CRNA, CRNA  Patient location: OR. Preanesthetic checklist: patient identified, IV checked, site marked, risks and benefits discussed, surgical consent, monitors and equipment checked, pre-op evaluation, timeout performed and anesthesia consent Patient sedated Right, radial was placed Catheter size: 20 G Hand hygiene performed , maximum sterile barriers used  and Seldinger technique used Allen's test indicative of satisfactory collateral circulation Attempts: 1 Following insertion, dressing applied. Post procedure assessment: normal  Patient tolerated the procedure well with no immediate complications.

## 2024-02-12 NOTE — Anesthesia Postprocedure Evaluation (Signed)
 Anesthesia Post Note  Patient: Deral K Sedeno  Procedure(s) Performed: ENDARTERECTOMY, FEMORAL with Vein Patch, Angiogram, Angioplasty, Profundaplasty, right leg stenting of SFA (Right) ULTRASOUND GUIDANCE, FOR VASCULAR ACCESS     Patient location during evaluation: PACU Anesthesia Type: General Level of consciousness: awake and alert Pain management: pain level controlled Vital Signs Assessment: post-procedure vital signs reviewed and stable Respiratory status: spontaneous breathing, nonlabored ventilation, respiratory function stable and patient connected to nasal cannula oxygen Cardiovascular status: blood pressure returned to baseline and stable Postop Assessment: no apparent nausea or vomiting Anesthetic complications: no   No notable events documented.  Last Vitals:  Vitals:   02/12/24 1530 02/12/24 1608  BP: (!) 146/76 (!) 146/77  Pulse: (!) 59 64  Resp: 11 16  Temp: 36.4 C 36.9 C  SpO2: 96% 98%    Last Pain:  Vitals:   02/12/24 1749  TempSrc:   PainSc: 5                  Zanyah Lentsch P Woodard Perrell

## 2024-02-12 NOTE — H&P (Signed)
 History and Physical Interval Note:  02/12/2024 9:49 AM  Gary Harrison  has presented today for surgery, with the diagnosis of ATHEROSCLEROSIS Bilateral lower extremeties.  The various methods of treatment have been discussed with the patient and family. After consideration of risks, benefits and other options for treatment, the patient has consented to  Procedure(s): ENDARTERECTOMY, FEMORAL (Right) BYPASS GRAFT FEMORAL-POPLITEAL ARTERY (Right) as a surgical intervention.  The patient's history has been reviewed, patient examined, no change in status, stable for surgery.  I have reviewed the patient's chart and labs.  Questions were answered to the patient's satisfaction.    Discussed plan for right common femoral endarterectomy with profundoplasty and possible femoropopliteal bypass after angiogram on 01/18/2024 showing significant distal common femoral disease with subtotal occlusion of the profunda and proximal SFA occlusion.  May delay bypass pending endarterectomy outcome given he says the numbness in his foot is better.  Gary JINNY Harrison     Patient name: Gary Harrison      MRN: [id number]        DOB: [birth year]          Sex: male   REASON FOR CONSULT: 3 month follow-up claudication   HPI: Gary Harrison is a 68 y.o. male, with history of hypertension and tobacco abuse that presents for 62-month follow-up of claudication.  Previously elected conservative management with Pletal  and walking therapies.  Today he states his right leg is bothering him more.  Can only walk about 100 yards and has to stop due to cramping in the right calf.  Now having numbness in the right foot that is worsening.  Does not feel the Pletal  helped at all.       Past Medical History:  Diagnosis Date   ED (erectile dysfunction)     H pylori ulcer      2014, multiple gastric and duodenal ulcers due to H. pylori.  Treated with amoxicillin , Biaxin , PPI   Hypertension      no meds currently   Impaired  fasting glucose     Peripheral vascular disease                 Past Surgical History:  Procedure Laterality Date   COLONOSCOPY   10/18/2007      DOQ:Uymzz descending colon polyps.  The polyps ranged from 8 mm  to 1.2 cm.  One pedunculated sigmoid colon polyp removed via snare cautery; others via cold forceps. One 3-mm cecal polyp removed via cold forceps.  Otherwise, no masses, inflammatory changes, diverticular, or arteriovenous malformations seen. simple adenomas. Needs surveillance 2014   COLONOSCOPY WITH ESOPHAGOGASTRODUODENOSCOPY (EGD) N/A 01/25/2013    Dr. harvey: Multiple small ulcers seen in the gastric antrum as well as the duodenum.  Biopsies showed H. pylori gastritis.  Need to come back for 54-month follow-up EGD but he did not have this done.  Colonoscopy showed small internal hemorrhoids, 8 colon polyps removed only one was a simple adenoma.  Next colonoscopy in 10 years.   COLONOSCOPY WITH PROPOFOL  N/A 05/02/2023    Procedure: COLONOSCOPY WITH PROPOFOL ;  Surgeon: Cindie Carlin MARLA, DO;  Location: [facility];  Service: Endoscopy;  Laterality: N/A;  10:30am, asa 2   POLYPECTOMY   05/02/2023    Procedure: POLYPECTOMY;  Surgeon: Cindie Carlin MARLA, DO;  Location: [facility];  Service: Endoscopy;;               Family History  Problem Relation Age of Onset   Cancer  Mother     Cancer Father     Colon cancer Neg Hx            SOCIAL HISTORY: Social History         Socioeconomic History   Marital status: Married      Spouse name: Not on file   Number of children: 4   Years of education: Not on file   Highest education level: Not on file  Occupational History      Comment: Retired  Tobacco Use   Smoking status: Every Day      Current packs/day: 0.50      Types: Cigarettes   Smokeless tobacco: Never   Tobacco comments:      Under a pack a day, 15 a day, does not want to quit  Vaping Use   Vaping status: Never Used  Substance and Sexual Activity   Alcohol  use: Yes      Comment: Consumes a mix drinking at least 3 beers every day.,  Remote DUI in the 80s.   Drug use: No   Sexual activity: Not on file  Other Topics Concern   Not on file  Social History Narrative   Not on file    Social Drivers of Health    Financial Resource Strain: Not on file  Food Insecurity: Not on file  Transportation Needs: Not on file  Physical Activity: Not on file  Stress: Not on file  Social Connections: Not on file  Intimate Partner Violence: Not on file      Allergies      Allergies  Allergen Reactions   Iodine                 Current Outpatient Medications  Medication Sig Dispense Refill   amLODipine  (NORVASC ) 10 MG tablet Take 1 tablet (10 mg total) by mouth daily. 90 tablet 3   aspirin  EC 81 MG tablet Take 81 mg by mouth daily. Swallow whole.       cilostazol  (PLETAL ) 100 MG tablet Take 1 tablet (100 mg total) by mouth 2 (two) times daily before a meal. 60 tablet 11   fluticasone  (FLONASE ) 50 MCG/ACT nasal spray SHAKE LIQUID AND USE 2 SPRAYS IN EACH NOSTRIL EVERY DAY 16 g 2   olmesartan (BENICAR) 20 MG tablet Take 20 mg by mouth daily.       rosuvastatin  (CRESTOR ) 20 MG tablet Take 20 mg by mouth daily.          No current facility-administered medications for this visit.        REVIEW OF SYSTEMS:  [X]  denotes positive finding, [ ]  denotes negative finding Cardiac   Comments:  Chest pain or chest pressure:      Shortness of breath upon exertion:      Short of breath when lying flat:      Irregular heart rhythm:             Vascular      Pain in calf, thigh, or hip brought on by ambulation: x Bilateral   Pain in feet at night that wakes you up from your sleep:       Blood clot in your veins:      Leg swelling:              Pulmonary      Oxygen at home:      Productive cough:       Wheezing:  Neurologic      Sudden weakness in arms or legs:       Sudden numbness in arms or legs:       Sudden onset of difficulty  speaking or slurred speech:      Temporary loss of vision in one eye:       Problems with dizziness:              Gastrointestinal      Blood in stool:       Vomited blood:              Genitourinary      Burning when urinating:       Blood in urine:             Psychiatric      Major depression:              Hematologic      Bleeding problems:      Problems with blood clotting too easily:             Skin      Rashes or ulcers:             Constitutional      Fever or chills:          PHYSICAL EXAM:    Vitals:    12/26/23 1528  BP: 135/74  Pulse: 75  Resp: 18  Temp: 98 F (36.7 C)  TempSrc: Temporal  SpO2: 98%  Weight: 167 lb 3.2 oz (75.8 kg)  Height: 5' 11 (1.803 m)      GENERAL: The patient is a well-nourished male, in no acute distress. The vital signs are documented above. CARDIAC: There is a regular rate and rhythm.  VASCULAR:  Bilateral femoral pulses palpable No palpable pedal pulses No lower extremity tissue loss PULMONARY: No respiratory distress. ABDOMEN: Soft and non-tender. MUSCULOSKELETAL: There are no major deformities or cyanosis. NEUROLOGIC: No focal weakness or paresthesias are detected. SKIN: There are no ulcers or rashes noted. PSYCHIATRIC: The patient has a normal affect.   DATA:    ABIs today are 0.33 on the right and 0.45 on the left monophasic (previously 0.66 on the right and 0.61 on the left)   Assessment/Plan:    68 y.o. male, with history of hypertension and tobacco abuse that presents for 33-month follow-up of intermittent claudication.  Unfortunately has had worsening symptoms during a trial of medical management.  Now with increasing right calf claudication after just 100 yards with some numbness in the foot is concerning for rest pain.  ABI on the right is very depressed at 0.33.  I have recommended aortogram, lower extremity arteriogram with a focus on the right leg initially since this leg is more symptomatic but will shoot  left leg runoff as well.  Discussed will evaluate for any endovascular percutaneous intervention options.  He has failed conservative management after an appropriate trial.  Ultimately may require open surgery including surgical bypass as we discussed.  Will get scheduled as an outpatient at [facility].     Gary DOROTHA Gaskins, MD [facility] [place] Office: [phone]

## 2024-02-12 NOTE — Anesthesia Procedure Notes (Signed)
 Procedure Name: Intubation Date/Time: 02/12/2024 10:06 AM  Performed by: Chaney Ozell CROME, CRNAPre-anesthesia Checklist: Patient identified, Emergency Drugs available, Suction available and Patient being monitored Patient Re-evaluated:Patient Re-evaluated prior to induction Oxygen Delivery Method: Circle System Utilized Preoxygenation: Pre-oxygenation with 100% oxygen Induction Type: IV induction Ventilation: Mask ventilation without difficulty Laryngoscope Size: Mac and 4 Grade View: Grade I Tube type: Oral Tube size: 8.0 mm Number of attempts: 1 Airway Equipment and Method: Stylet and Oral airway Placement Confirmation: ETT inserted through vocal cords under direct vision, positive ETCO2 and breath sounds checked- equal and bilateral Secured at: 23 cm Tube secured with: Tape Dental Injury: Teeth and Oropharynx as per pre-operative assessment

## 2024-02-12 NOTE — Transfer of Care (Signed)
 Immediate Anesthesia Transfer of Care Note  Patient: Gary Harrison  Procedure(s) Performed: ENDARTERECTOMY, FEMORAL with Vein Patch, Angiogram, Angioplasty, Profundaplasty, right leg stenting of SFA (Right) ULTRASOUND GUIDANCE, FOR VASCULAR ACCESS  Patient Location: PACU  Anesthesia Type:General  Level of Consciousness: awake, alert , oriented, drowsy, and patient cooperative  Airway & Oxygen Therapy: Patient Spontanous Breathing and Patient connected to nasal cannula oxygen  Post-op Assessment: Report given to RN, Post -op Vital signs reviewed and stable, and Patient moving all extremities  Post vital signs: Reviewed and stable  Last Vitals:  Vitals Value Taken Time  BP 150/79 02/12/24 13:13  Temp 36.4 C 02/12/24 13:13  Pulse 62 02/12/24 13:14  Resp 10 02/12/24 13:14  SpO2 97 % 02/12/24 13:14  Vitals shown include unfiled device data.  Last Pain:  Vitals:   02/12/24 0819  TempSrc:   PainSc: 0-No pain         Complications: No notable events documented.

## 2024-02-13 ENCOUNTER — Encounter (HOSPITAL_COMMUNITY): Payer: Self-pay | Admitting: Vascular Surgery

## 2024-02-13 LAB — CBC
HCT: 28.6 % — ABNORMAL LOW (ref 39.0–52.0)
Hemoglobin: 10.1 g/dL — ABNORMAL LOW (ref 13.0–17.0)
MCH: 35.3 pg — ABNORMAL HIGH (ref 26.0–34.0)
MCHC: 35.3 g/dL (ref 30.0–36.0)
MCV: 100 fL (ref 80.0–100.0)
Platelets: 217 K/uL (ref 150–400)
RBC: 2.86 MIL/uL — ABNORMAL LOW (ref 4.22–5.81)
RDW: 11.9 % (ref 11.5–15.5)
WBC: 17.6 K/uL — ABNORMAL HIGH (ref 4.0–10.5)
nRBC: 0 % (ref 0.0–0.2)

## 2024-02-13 LAB — BASIC METABOLIC PANEL WITH GFR
Anion gap: 15 (ref 5–15)
BUN: 11 mg/dL (ref 8–23)
CO2: 19 mmol/L — ABNORMAL LOW (ref 22–32)
Calcium: 8.4 mg/dL — ABNORMAL LOW (ref 8.9–10.3)
Chloride: 104 mmol/L (ref 98–111)
Creatinine, Ser: 0.81 mg/dL (ref 0.61–1.24)
GFR, Estimated: >60 mL/min (ref >60)
Glucose, Bld: 127 mg/dL — ABNORMAL HIGH (ref 70–99)
Potassium: 3.6 mmol/L (ref 3.5–5.1)
Sodium: 138 mmol/L (ref 135–145)

## 2024-02-13 LAB — LIPID PANEL
Cholesterol: 84 mg/dL (ref 0–200)
HDL: 36 mg/dL — ABNORMAL LOW (ref >40)
LDL Cholesterol: 30 mg/dL (ref 0–99)
Total CHOL/HDL Ratio: 2.3 ratio
Triglycerides: 88 mg/dL (ref <150)
VLDL: 18 mg/dL (ref 0–40)

## 2024-02-13 NOTE — Evaluation (Signed)
 Occupational Therapy Evaluation Patient Details Name: Gary Harrison MRN: [id number] DOB: [birth year] Today's Date: 02/13/2024   History of Present Illness   68 y/o M s/p R common femoral endarterectomy of proximal SFA with vein patch angioplasty with stent placement on 12/9. PMH includes HTN, PVD, polypectomy     Clinical Impressions Pt ind at baseline with ADLs and functional mobility, lives with spouse who can assist at d/c. Currently, pt needs up to mod A for ADLS (specifically LB ADLs), CGA for bed mobility and transfers without AD. Pt with 7/10 R groin pain, incision CDI post-session. Pt presenting with impairments listed below, will follow acutely. Anticipate no OT follow up needs at d/c.     If plan is discharge home, recommend the following:   A little help with walking and/or transfers;A little help with bathing/dressing/bathroom;Assistance with cooking/housework;Assist for transportation     Functional Status Assessment   Patient has had a recent decline in their functional status and demonstrates the ability to make significant improvements in function in a reasonable and predictable amount of time.     Equipment Recommendations   None recommended by OT     Recommendations for Other Services   PT consult     Precautions/Restrictions   Precautions Precautions: Fall Restrictions Weight Bearing Restrictions Per Provider Order: No     Mobility Bed Mobility Overal bed mobility: Needs Assistance Bed Mobility: Supine to Sit, Sit to Supine     Supine to sit: Contact guard Sit to supine: Contact guard assist        Transfers Overall transfer level: Needs assistance Equipment used: None Transfers: Sit to/from Stand Sit to Stand: Contact guard assist                  Balance Overall balance assessment: Mild deficits observed, not formally tested                                         ADL either performed or assessed  with clinical judgement   ADL Overall ADL's : Needs assistance/impaired Eating/Feeding: Set up   Grooming: Set up   Upper Body Bathing: Minimal assistance   Lower Body Bathing: Minimal assistance   Upper Body Dressing : Minimal assistance   Lower Body Dressing: Moderate assistance   Toilet Transfer: Contact guard assist   Toileting- Clothing Manipulation and Hygiene: Contact guard assist       Functional mobility during ADLs: Contact guard assist       Vision   Vision Assessment?: No apparent visual deficits     Perception Perception: Not tested       Praxis Praxis: Not tested       Pertinent Vitals/Pain Pain Assessment Pain Assessment: Faces Pain Score: 6  Faces Pain Scale: Hurts even more Pain Location: R groin/incision Pain Descriptors / Indicators: Discomfort Pain Intervention(s): Limited activity within patient's tolerance, Monitored during session, Repositioned     Extremity/Trunk Assessment Upper Extremity Assessment Upper Extremity Assessment: Generalized weakness   Lower Extremity Assessment Lower Extremity Assessment: Defer to PT evaluation   Cervical / Trunk Assessment Cervical / Trunk Assessment: Normal   Communication Communication Communication: No apparent difficulties   Cognition Arousal: Alert Behavior During Therapy: WFL for tasks assessed/performed Cognition: No apparent impairments  Following commands: Intact       Cueing  General Comments   Cueing Techniques: Verbal cues  VSS on RA, R groin incisoin CDI   Exercises     Shoulder Instructions      Home Living Family/patient expects to be discharged to:: Private residence Living Arrangements: Spouse/significant other Available Help at Discharge: Family;Available PRN/intermittently (spouse works from home) Type of Home: House Home Access: Level entry     Home Layout: One level     Bathroom Shower/Tub: Walk-in shower          Home Equipment: Agricultural Consultant (2 wheels);Tub bench          Prior Functioning/Environment Prior Level of Function : Independent/Modified Independent                    OT Problem List: Decreased strength;Decreased range of motion;Decreased activity tolerance;Impaired balance (sitting and/or standing)   OT Treatment/Interventions: Self-care/ADL training;Therapeutic exercise;Energy conservation;DME and/or AE instruction;Therapeutic activities;Patient/family education;Balance training      OT Goals(Current goals can be found in the care plan section)   Acute Rehab OT Goals Patient Stated Goal: none stated OT Goal Formulation: With patient Time For Goal Achievement: 02/27/24 Potential to Achieve Goals: Good ADL Goals Pt Will Perform Lower Body Dressing: with modified independence;sitting/lateral leans;sit to/from stand Pt Will Perform Tub/Shower Transfer: Shower transfer;with modified independence;ambulating Additional ADL Goal #1: Pt will tolerate x15 min standing functional activity in order to improve activity tolerance for ADLs   OT Frequency:  Min 1X/week    Co-evaluation              AM-PAC OT 6 Clicks Daily Activity     Outcome Measure Help from another person eating meals?: None Help from another person taking care of personal grooming?: None Help from another person toileting, which includes using toliet, bedpan, or urinal?: A Little Help from another person bathing (including washing, rinsing, drying)?: A Little Help from another person to put on and taking off regular upper body clothing?: A Little Help from another person to put on and taking off regular lower body clothing?: A Lot 6 Click Score: 19   End of Session Equipment Utilized During Treatment: Gait belt Nurse Communication: Mobility status  Activity Tolerance: Patient tolerated treatment well Patient left: in bed;with call bell/phone within reach;with bed alarm set;with  family/visitor present  OT Visit Diagnosis: Unsteadiness on feet (R26.81);Other abnormalities of gait and mobility (R26.89);Muscle weakness (generalized) (M62.81)                Time: 9068-9046 OT Time Calculation (min): 22 min Charges:  OT General Charges $OT Visit: 1 Visit OT Evaluation $OT Eval Low Complexity: 1 Low  Jerrye Seebeck K, OTD, OTR/L SecureChat Preferred Acute Rehab [phone]   Amna Welker K Koonce 02/13/2024, 10:13 AM

## 2024-02-13 NOTE — Progress Notes (Signed)
 PHARMACIST LIPID MONITORING   Gary Harrison is a 68 y.o. male admitted on 02/12/2024 for R CFA endarterectomy.  Pharmacy has been consulted to optimize lipid-lowering therapy with the indication of secondary prevention for clinical ASCVD.  Recent Labs:  Lipid Panel (last 6 months):   Lab Results  Component Value Date   CHOL 84 02/13/2024   TRIG 88 02/13/2024   HDL 36 (L) 02/13/2024   CHOLHDL 2.3 02/13/2024   VLDL 18 02/13/2024   LDLCALC 30 02/13/2024    Hepatic function panel (last 6 months):   Lab Results  Component Value Date   AST 33 02/08/2024   ALT 37 02/08/2024   ALKPHOS 77 02/08/2024   BILITOT 0.8 02/08/2024    SCr (since admission):   Serum creatinine: 0.81 mg/dL 87/90/74 9574 Estimated creatinine clearance: 95.5 mL/min  Current therapy and lipid therapy tolerance Current lipid-lowering therapy: rosuvastatin  20mg   Assessment:   Pt at goal (LDL 30) on current high intensity statin therapy.  Plan:    1.Statin intensity (high intensity recommended for all patients regardless of the LDL):  No statin changes. The patient is already on a high intensity statin.   Vito Ralph, PharmD, BCPS Please see amion for complete clinical pharmacist phone list 02/13/2024, 8:32 AM

## 2024-02-13 NOTE — Discharge Instructions (Signed)
 [facility]  Discharge instructions  Lower Extremity Bypass Surgery  Please refer to the following instruction for your post-procedure care. Your surgeon or physician assistant will discuss any changes with you.  Activity  You are encouraged to walk as much as you can. You can slowly return to normal activities during the month after your surgery. Avoid strenuous activity and heavy lifting until your doctor tells you it's OK. Avoid activities such as vacuuming or swinging a golf club. Do not drive until your doctor give the OK and you are no longer taking prescription pain medications. It is also normal to have difficulty with sleep habits, eating and bowel movement after surgery. These will go away with time.  Bathing/Showering  Shower daily after you go home. Do not soak in a bathtub, hot tub, or swim until the incision heals completely.  Incision Care  Clean your incision with mild soap and water. Shower every day. Pat the area dry with a clean towel. You do not need a bandage unless otherwise instructed. Do not apply any ointments or creams to your incision. If you have open wounds you will be instructed how to care for them or a visiting nurse may be arranged for you. If you have staples or sutures along your incision they will be removed at your post-op appointment. You may have skin glue on your incision. Do not peel it off. It will come off on its own in about one week.  Wash the groin wound with soap and water daily and pat dry. (No tub bath-only shower)  Then put a dry gauze or washcloth in the groin to keep this area dry to help prevent wound infection.  Do this daily and as needed.  Do not use Vaseline or neosporin on your incisions.  Only use soap and water on your incisions and then protect and keep dry.  Diet  Resume your normal diet. There are no special food restrictions following this procedure. A low fat/ low cholesterol diet is  recommended for all patients with vascular disease. In order to heal from your surgery, it is CRITICAL to get adequate nutrition. Your body requires vitamins, minerals, and protein. Vegetables are the best source of vitamins and minerals. Vegetables also provide the perfect balance of protein. Processed food has little nutritional value, so try to avoid this.  Medications  Resume taking all your medications unless your doctor or physician assistant tells you not to. If your incision is causing pain, you may take over-the-counter pain relievers such as acetaminophen  (Tylenol ). If you were prescribed a stronger pain medication, please aware these medication can cause nausea and constipation. Prevent nausea by taking the medication with a snack or meal. Avoid constipation by drinking plenty of fluids and eating foods with high amount of fiber, such as fruits, vegetables, and grains. Take Colace 100 mg (an over-the-counter stool softener) twice a day as needed for constipation.  Do not take Tylenol  if you are taking prescription pain medications.  Follow Up  Our office will schedule a follow up appointment 2-3 weeks following discharge.  Please call us  immediately for any of the following conditions  Severe or worsening pain in your legs or feet while at rest or while walking Increase pain, redness, warmth, or drainage (pus) from your incision site(s) Fever of 101 degree or higher The swelling in your leg with the bypass suddenly worsens and becomes more painful than when you were in the hospital If you have  been instructed to feel your graft pulse then you should do so every day. If you can no longer feel this pulse, call the office immediately. Not all patients are given this instruction.  Leg swelling is common after leg bypass surgery.  The swelling should improve over a few months following surgery. To improve the swelling, you may elevate your legs above the level of your heart while you are  sitting or resting. Your surgeon or physician assistant may ask you to apply an ACE wrap or wear compression (TED) stockings to help to reduce swelling.  Reduce your risk of vascular disease  Stop smoking. If you would like help call [facility] at 1-800-QUIT-NOW (609-485-9870) or [facility] at 417-855-7064.  Manage your cholesterol Maintain a desired weight Control your diabetes weight Control your diabetes Keep your blood pressure down  If you have any questions, please call the office at [phone]

## 2024-02-13 NOTE — Plan of Care (Signed)

## 2024-02-13 NOTE — Progress Notes (Addendum)
  Progress Note    02/13/2024 6:44 AM 1 Day Post-Op  Subjective:  no complaints; just standing up at bedside with nursing for weight and walk.    Afebrile HR 50's-60's  120's-130's systolic 100% [address]  Vitals:   02/13/24 0308 02/13/24 0400  BP: 133/76 133/73  Pulse: (!) 55 (!) 52  Resp: 13 13  Temp: 98.3 F (36.8 C)   SpO2: 99% 98%    Physical Exam: General:  no distress Cardiac:  regular Lungs:  non labored Incisions:  right groin is clean and dry  Extremities:  + doppler flow right DP/PT; right calf soft.    CBC    Component Value Date/Time   WBC 17.6 (H) 02/13/2024 0425   RBC 2.86 (L) 02/13/2024 0425   HGB 10.1 (L) 02/13/2024 0425   HGB 15.0 01/15/2020 1142   HCT 28.6 (L) 02/13/2024 0425   HCT 44.2 01/15/2020 1142   PLT 217 02/13/2024 0425   PLT 261 01/15/2020 1142   MCV 100.0 02/13/2024 0425   MCV 102 (H) 01/15/2020 1142   MCH 35.3 (H) 02/13/2024 0425   MCHC 35.3 02/13/2024 0425   RDW 11.9 02/13/2024 0425   RDW 11.3 (L) 01/15/2020 1142   LYMPHSABS 2.6 04/03/2018 1639   EOSABS 0.2 04/03/2018 1639   BASOSABS 0.1 04/03/2018 1639    BMET    Component Value Date/Time   NA 138 02/13/2024 0425   NA 140 01/15/2020 1142   K 3.6 02/13/2024 0425   CL 104 02/13/2024 0425   CO2 19 (L) 02/13/2024 0425   GLUCOSE 127 (H) 02/13/2024 0425   BUN 11 02/13/2024 0425   BUN 12 01/15/2020 1142   CREATININE 0.81 02/13/2024 0425   CREATININE 0.88 01/11/2013 1034   CALCIUM  8.4 (L) 02/13/2024 0425   GFRNONAA >60 02/13/2024 0425   GFRAA 105 01/15/2020 1142    INR    Component Value Date/Time   INR 1.0 02/08/2024 1530     Intake/Output Summary (Last 24 hours) at 02/13/2024 0644 Last data filed at 02/13/2024 9360 Gross per 24 hour  Intake 2581.55 ml  Output 2500 ml  Net 81.55 ml      Assessment/Plan:  68 y.o. male is s/p:  Right CFA endarterectomy  including profundoplasty and endarterectomy of the proximal SFA with vein patch angioplasty, right lower  extremity angiogram with right SFA angioplasty with stent placement 02/13/2024 by Dr. Gretta 1 Day Post-Op   -pt with brisk right DP/PT doppler flow.   Right calf and anterior compartments are soft and non tender.  -acute surgical blood loss anemia-tolerating -normal renal function -current smoker-discussed importance of smoking cessation.  -ambulate.  -DVT prophylaxis:  sq heparin  -continue asa/Plavix /Crestor     Lucie Apt, PA-C [facility] [phone] 02/13/2024 6:44 AM  I have seen and evaluated the patient. I agree with the PA note as documented above.  Postop day 1 status post right common femoral endarterectomy with profundoplasty and endarterectomy of the SFA with vein patch and antegrade right SFA stenting.  Right groin looks great.  Brisk Doppler signals in the foot.  Continue aspirin  Plavix  statin.  Needs another day will get out of bed and mobilize today.  Labs reassuring.  Lonni DOROTHA Gretta, MD [facility] [place] Office: [phone]

## 2024-02-13 NOTE — Evaluation (Signed)
 Physical Therapy Evaluation Patient Details Name: Gary Harrison MRN: [id number] DOB: March 11, 1955 Today's Date: 02/13/2024  History of Present Illness  68 y/o M s/p R common femoral endarterectomy of proximal SFA with vein patch angioplasty with stent placement on 12/9. PMH includes HTN, PVD, polypectomy  Clinical Impression  Prior to admittance, pt was mobilizing independently without an AD and was independent with ADLs. Pt presents to evaluation with deficits in mobility, power, strength, activity tolerance, and pain, all limiting pt's ability to mobilize. Pt was able to ambulate without an AD and no physical assistance given, with no notable losses of balance. Therapist provided education on the importance of frequent mobilization both while admitted and following discharge. Pt would benefit from LE strengthening and balance training. PT will continue to treat pt while he is admitted. No follow up therapies recommended at this time.         If plan is discharge home, recommend the following: Assist for transportation   Can travel by private vehicle        Equipment Recommendations None recommended by PT  Recommendations for Other Services       Functional Status Assessment Patient has had a recent decline in their functional status and demonstrates the ability to make significant improvements in function in a reasonable and predictable amount of time.     Precautions / Restrictions Precautions Precautions: Fall Recall of Precautions/Restrictions: Intact Restrictions Weight Bearing Restrictions Per Provider Order: No      Mobility  Bed Mobility Overal bed mobility: Needs Assistance Bed Mobility: Supine to Sit, Sit to Supine     Supine to sit: Supervision, HOB elevated Sit to supine: Supervision   General bed mobility comments: Pt performed supine to sit with HOB elevated and sit to supine with HOB flat as per home set up without physical assistance. Increased time to  complete.    Transfers Overall transfer level: Needs assistance Equipment used: None Transfers: Sit to/from Stand Sit to Stand: Contact guard assist           General transfer comment: STS from EOB w/out AD. Increased time to complete.    Ambulation/Gait Ambulation/Gait assistance: Supervision Gait Distance (Feet): 250 Feet Assistive device: None Gait Pattern/deviations: Step-through pattern, Decreased stride length Gait velocity: functional Gait velocity interpretation: 1.31 - 2.62 ft/sec, indicative of limited community ambulator   General Gait Details: Pt demonstrates reciprocal gait pattern throughout ambulation.  Stairs            Wheelchair Mobility     Tilt Bed    Modified Rankin (Stroke Patients Only)       Balance Overall balance assessment: Needs assistance Sitting-balance support: No upper extremity supported, Feet supported Sitting balance-Leahy Scale: Good Sitting balance - Comments: seated EOB   Standing balance support: No upper extremity supported, During functional activity Standing balance-Leahy Scale: Good Standing balance comment: able to shift weight for ambualtion without LOB                             Pertinent Vitals/Pain Pain Assessment Pain Assessment: No/denies pain Pain Score: 0-No pain Pain Intervention(s): Monitored during session    Home Living Family/patient expects to be discharged to:: Private residence Living Arrangements: Spouse/significant other Available Help at Discharge: Family;Available PRN/intermittently (spouse works from home) Type of Home: House Home Access: Level entry       Home Layout: One level Home Equipment: [employer] (2 wheels);Tub bench;Grab bars - tub/shower  Prior Function Prior Level of Function : Independent/Modified Independent (retired)             Mobility Comments: indep ADLs Comments: indep     Extremity/Trunk Assessment   Upper Extremity  Assessment Upper Extremity Assessment: Defer to OT evaluation    Lower Extremity Assessment Lower Extremity Assessment: Generalized weakness    Cervical / Trunk Assessment Cervical / Trunk Assessment: Normal  Communication   Communication Communication: No apparent difficulties    Cognition Arousal: Alert Behavior During Therapy: WFL for tasks assessed/performed   PT - Cognitive impairments: No apparent impairments                         Following commands: Intact       Cueing Cueing Techniques: Verbal cues     General Comments General comments (skin integrity, edema, etc.): VSS on RA    Exercises     Assessment/Plan    PT Assessment Patient needs continued PT services  PT Problem List Decreased strength;Decreased range of motion;Decreased activity tolerance;Decreased balance;Decreased mobility;Decreased knowledge of use of DME;Pain       PT Treatment Interventions DME instruction;Stair training;Functional mobility training;Therapeutic activities;Therapeutic exercise;Balance training;Patient/family education;Manual techniques;Wheelchair mobility training    PT Goals (Current goals can be found in the Care Plan section)  Acute Rehab PT Goals Patient Stated Goal: to go home PT Goal Formulation: With patient/family Time For Goal Achievement: 02/27/24 Potential to Achieve Goals: Good    Frequency Min 2X/week     Co-evaluation               AM-PAC PT 6 Clicks Mobility  Outcome Measure Help needed turning from your back to your side while in a flat bed without using bedrails?: A Little Help needed moving from lying on your back to sitting on the side of a flat bed without using bedrails?: A Little Help needed moving to and from a bed to a chair (including a wheelchair)?: A Little Help needed standing up from a chair using your arms (e.g., wheelchair or bedside chair)?: A Little Help needed to walk in hospital room?: A Little Help needed  climbing 3-5 steps with a railing? : Total 6 Click Score: 16    End of Session Equipment Utilized During Treatment: Gait belt Activity Tolerance: Patient tolerated treatment well Patient left: in bed;with call bell/phone within reach;with bed alarm set;with family/visitor present Nurse Communication: Mobility status PT Visit Diagnosis: Other abnormalities of gait and mobility (R26.89);Muscle weakness (generalized) (M62.81);Pain Pain - Right/Left: Right Pain - part of body: Hip    Time: 1235-1252 PT Time Calculation (min) (ACUTE ONLY): 17 min   Charges:   PT Evaluation $PT Eval Moderate Complexity: 1 Mod   PT General Charges $$ ACUTE PT VISIT: 1 Visit         Leontine Hilt DPT [facility] Rehab Services [phone] Prefer contact via chat   Dawanna Grauberger B Cotina Freedman 02/13/2024, 1:30 PM

## 2024-02-13 NOTE — Progress Notes (Signed)
 PT Cancellation Note  Patient Details Name: Gary Harrison MRN: [id number] DOB: 23-Aug-1955   Cancelled Treatment:    Reason Eval/Treat Not Completed: Other (comment) (Been up 2 x this am with staff and wants to wait to get pain meds.  Will return later this pm.)   [name] [name] 02/13/2024, 10:34 AM Edwards Mckelvie M,PT [facility] Rehab Services [phone]

## 2024-02-14 ENCOUNTER — Other Ambulatory Visit (HOSPITAL_COMMUNITY): Payer: Self-pay

## 2024-02-14 DIAGNOSIS — D62 Acute posthemorrhagic anemia: Secondary | ICD-10-CM

## 2024-02-14 LAB — CBC
HCT: 29.8 % — ABNORMAL LOW (ref 39.0–52.0)
Hemoglobin: 10.1 g/dL — ABNORMAL LOW (ref 13.0–17.0)
MCH: 34.4 pg — ABNORMAL HIGH (ref 26.0–34.0)
MCHC: 33.9 g/dL (ref 30.0–36.0)
MCV: 101.4 fL — ABNORMAL HIGH (ref 80.0–100.0)
Platelets: 249 K/uL (ref 150–400)
RBC: 2.94 MIL/uL — ABNORMAL LOW (ref 4.22–5.81)
RDW: 12.1 % (ref 11.5–15.5)
WBC: 15.4 K/uL — ABNORMAL HIGH (ref 4.0–10.5)
nRBC: 0 % (ref 0.0–0.2)

## 2024-02-14 LAB — BASIC METABOLIC PANEL WITH GFR
Anion gap: 7 (ref 5–15)
BUN: 10 mg/dL (ref 8–23)
CO2: 26 mmol/L (ref 22–32)
Calcium: 8.5 mg/dL — ABNORMAL LOW (ref 8.9–10.3)
Chloride: 105 mmol/L (ref 98–111)
Creatinine, Ser: 0.77 mg/dL (ref 0.61–1.24)
GFR, Estimated: >60 mL/min (ref >60)
Glucose, Bld: 104 mg/dL — ABNORMAL HIGH (ref 70–99)
Potassium: 3.3 mmol/L — ABNORMAL LOW (ref 3.5–5.1)
Sodium: 138 mmol/L (ref 135–145)

## 2024-02-14 MED ORDER — CLOPIDOGREL BISULFATE 75 MG PO TABS
75.0000 mg | ORAL_TABLET | Freq: Every day | ORAL | 3 refills | Status: AC
  Filled 2024-02-14: qty 90, 90d supply, fill #0

## 2024-02-14 MED ORDER — OXYCODONE-ACETAMINOPHEN 5-325 MG PO TABS
1.0000 | ORAL_TABLET | Freq: Four times a day (QID) | ORAL | 0 refills | Status: AC | PRN
  Filled 2024-02-14: qty 20, 5d supply, fill #0

## 2024-02-14 NOTE — Plan of Care (Signed)

## 2024-02-14 NOTE — Progress Notes (Addendum)
°  Progress Note    02/14/2024 6:21 AM 2 Days Post-Op  Subjective:  says he is a little sore but walked yesterday  Afebrile HR 50's-60's  120's-150's systolic 95% [place]  Vitals:   02/14/24 0200 02/14/24 0316  BP:  (!) 143/82  Pulse: (!) 54 61  Resp: 14 17  Temp:  98.2 F (36.8 C)  SpO2: 96% 97%    Physical Exam: General:  resting comfortably, no distress Cardiac:  regular Lungs:  non labored Incisions:  right groin clean and in tact.  Extremities:  brisk doppler flow right DP/PT   CBC    Component Value Date/Time   WBC 15.4 (H) 02/14/2024 0330   RBC 2.94 (L) 02/14/2024 0330   HGB 10.1 (L) 02/14/2024 0330   HGB 15.0 01/15/2020 1142   HCT 29.8 (L) 02/14/2024 0330   HCT 44.2 01/15/2020 1142   PLT 249 02/14/2024 0330   PLT 261 01/15/2020 1142   MCV 101.4 (H) 02/14/2024 0330   MCV 102 (H) 01/15/2020 1142   MCH 34.4 (H) 02/14/2024 0330   MCHC 33.9 02/14/2024 0330   RDW 12.1 02/14/2024 0330   RDW 11.3 (L) 01/15/2020 1142   LYMPHSABS 2.6 04/03/2018 1639   EOSABS 0.2 04/03/2018 1639   BASOSABS 0.1 04/03/2018 1639    BMET    Component Value Date/Time   NA 138 02/14/2024 0330   NA 140 01/15/2020 1142   K 3.3 (L) 02/14/2024 0330   CL 105 02/14/2024 0330   CO2 26 02/14/2024 0330   GLUCOSE 104 (H) 02/14/2024 0330   BUN 10 02/14/2024 0330   BUN 12 01/15/2020 1142   CREATININE 0.77 02/14/2024 0330   CREATININE 0.88 01/11/2013 1034   CALCIUM  8.5 (L) 02/14/2024 0330   GFRNONAA >60 02/14/2024 0330   GFRAA 105 01/15/2020 1142    INR    Component Value Date/Time   INR 1.0 02/08/2024 1530     Intake/Output Summary (Last 24 hours) at 02/14/2024 9378 Last data filed at 02/13/2024 1514 Gross per 24 hour  Intake 908.18 ml  Output 910 ml  Net -1.82 ml      Assessment/Plan:  68 y.o. male is s/p:  Right CFA endarterectomy  including profundoplasty and endarterectomy of the proximal SFA with vein patch angioplasty, right lower extremity angiogram with right SFA  angioplasty with stent placement 02/13/2024 by Dr. Gretta   2 Days Post-Op   -pt with brisk doppler flow right DP/PT.   -acute surgical blood loss anemia-hgb stable at 10.1, which is unchanged from yesterday.  -DVT prophylaxis:  sq heparin  -most likely home today.  F/u in 2-3 weeks for incision check on Dr. Gretta clinic day.   -home on asa/statin/plavix    Lucie Apt, PA-C [facility] [phone] 02/14/2024 6:21 AM  I have seen and evaluated the patient. I agree with the PA note as documented above.  Postop day 2 status post right common femoral endarterectomy with profundoplasty and antegrade SFA stent.  Right groin looks great.  Brisk Doppler signals in the foot.  Walked yesterday with therapy.  Plan discharge today and follow-up in 2 to 3 weeks with incision check.  Aspirin  statin Plavix  as we discussed.  Lonni DOROTHA Gretta, MD [facility] [place] Office: [phone]

## 2024-02-14 NOTE — Progress Notes (Signed)
 Discharge   Patient and brother Gary Harrison expressed verbal understanding of discharge POC.   Patient and brother given time to ask any questions.  Additional education included in AVS.  Alert oriented in good spirits.   Tele and PIV removed by Sharlet PEAK.  Pressure dressings intact.  TOC meds ready Discharging to [facility].

## 2024-02-14 NOTE — Progress Notes (Signed)
°   02/14/24 1025  TOC Brief Assessment  Insurance and Status Reviewed  Patient has primary care physician Yes  Home environment has been reviewed home w/ spouse  Prior level of function: independent  Prior/Current Home Services No current home services  Social Drivers of Health Review SDOH reviewed no interventions necessary  Readmission risk has been reviewed Yes  Transition of care needs no transition of care needs at this time    Pt stable for transition home today, per PT/OT evals no follow up recommended. No DME needs noted. Family to transport home. CM received notice from [facility] liaison that [facility] office made pre-op referral for [facility] - liaison updated that pt had no needs on discharge.

## 2024-02-14 NOTE — Discharge Summary (Signed)
 Discharge Summary     Gary Harrison March 02, 1956 68 y.o. male  [id number]  Admission Date: 02/12/2024  Discharge Date: 02/14/2024  Physician: No att. providers found  Admission Diagnosis: Atherosclerosis of native artery of both lower extremities with intermittent claudication [I70.213] Status post femoral-popliteal bypass surgery [Z95.828] Claudication [I73.9]  HPI:   This is a 68 y.o. male seen with disabling claudication of the right lower extremity.  He underwent angiogram showing occluded distal common femoral with high-grade versus subtotal occlusion of the profunda and occluded proximal SFA.  He presents for right lower extremity angiogram with possible bypass after risk-benefits discussed.  Assistant was needed given the complexity the case and also for the endarterectomy and vein harvest.   Hospital Course:  The patient was admitted to the hospital and taken to the operating room on 02/12/2024 and underwent: 1.  Harvest of right leg great saphenous vein 2.  Right common femoral endarterectomy including profundoplasty and endarterectomy of the proximal SFA with vein patch angioplasty 3.  Right lower extremity angiogram 4.  Right SFA angioplasty with stent placement (6 mm x 80 mm drug-coated Eluvia)    Findings: Extensive endarterectomy was performed from the common femoral onto the two main profunda branches and also endarterectomy of the proximal SFA.  A vein patch angioplasty was performed with GSV.  I was not happy with signals in the foot so I shot a right lower extremity angiogram and antegrade puncture of the patch.  There was a high-grade stenosis remnant just distal to her SFA endarterectomy that was stented with a 6 mm x 80 mm Eluvia postdilated with a 5 mm Mustang.  Widely patent stent at completion with widely patent femoral endarterectomy and profunda vessels.   The pt tolerated the procedure well and was transported to the PACU in good condition.   By POD 1,  Right groin looks great. Brisk Doppler signals in the foot. Continue aspirin  Plavix  statin. Needs another day will get out of bed and mobilize today. Labs reassuring.   POD 2, Right groin looks great. Brisk Doppler signals in the foot. Walked yesterday with therapy. Plan discharge today and follow-up in 2 to 3 weeks with incision check. Aspirin  statin Plavix  as we discussed.   Pt discharged home.    CBC    Component Value Date/Time   WBC 15.4 (H) 02/14/2024 0330   RBC 2.94 (L) 02/14/2024 0330   HGB 10.1 (L) 02/14/2024 0330   HGB 15.0 01/15/2020 1142   HCT 29.8 (L) 02/14/2024 0330   HCT 44.2 01/15/2020 1142   PLT 249 02/14/2024 0330   PLT 261 01/15/2020 1142   MCV 101.4 (H) 02/14/2024 0330   MCV 102 (H) 01/15/2020 1142   MCH 34.4 (H) 02/14/2024 0330   MCHC 33.9 02/14/2024 0330   RDW 12.1 02/14/2024 0330   RDW 11.3 (L) 01/15/2020 1142   LYMPHSABS 2.6 04/03/2018 1639   EOSABS 0.2 04/03/2018 1639   BASOSABS 0.1 04/03/2018 1639    BMET    Component Value Date/Time   NA 138 02/14/2024 0330   NA 140 01/15/2020 1142   K 3.3 (L) 02/14/2024 0330   CL 105 02/14/2024 0330   CO2 26 02/14/2024 0330   GLUCOSE 104 (H) 02/14/2024 0330   BUN 10 02/14/2024 0330   BUN 12 01/15/2020 1142   CREATININE 0.77 02/14/2024 0330   CREATININE 0.88 01/11/2013 1034   CALCIUM  8.5 (L) 02/14/2024 0330   GFRNONAA >60 02/14/2024 0330   GFRAA 105 01/15/2020 1142  Discharge Instructions     Discharge patient   Complete by: As directed    Dc meds sent to [facility].   Discharge disposition: 01-Home or Self Care   Discharge patient date: 02/14/2024       Discharge Diagnosis:  Atherosclerosis of native artery of both lower extremities with intermittent claudication [I70.213] Status post femoral-popliteal bypass surgery [Z95.828] Claudication [I73.9]  Secondary Diagnosis: Patient Active Problem List   Diagnosis Date Noted   Status post femoral-popliteal bypass surgery 02/12/2024   Claudication  02/12/2024   Atherosclerosis of native arteries of extremity with rest pain (HCC) 12/26/2023   Atherosclerosis of native arteries of extremity with intermittent claudication 09/12/2023   Chest wall muscle strain 03/25/2020   Chest pain 03/25/2020   Non-recurrent acute suppurative otitis media of left ear without spontaneous rupture of tympanic membrane 02/21/2020   Alcoholic liver disease 09/13/2018   H pylori ulcer 12/29/2017    Class: History of   Alcohol dependence (HCC) 12/22/2017   Alcohol abuse, daily use 12/22/2017   Hepatic cirrhosis (HCC) 12/22/2017   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 pylori.  Treated with amoxicillin , Biaxin , PPI   History of kidney stones    Hypertension    no meds currently   Impaired fasting glucose    Peripheral vascular disease      Allergies as of 02/14/2024       Reactions   Iodine  Swelling        Medication List     STOP taking these medications    cilostazol  100 MG tablet Commonly known as: PLETAL        TAKE these medications    amLODipine  10 MG tablet Commonly known as: NORVASC  Take 1 tablet (10 mg total) by mouth daily.   aspirin  EC 81 MG tablet Take 81 mg by mouth daily. Swallow whole.   clopidogrel  75 MG tablet Commonly known as: PLAVIX  Take 1 tablet (75 mg total) by mouth daily at 6 (six) AM. Start taking on: February 15, 2024   fluticasone  50 MCG/ACT nasal spray Commonly known as: FLONASE  SHAKE LIQUID AND USE 2 SPRAYS IN EACH NOSTRIL EVERY DAY   olmesartan 40 MG tablet Commonly known as: BENICAR Take 40 mg by mouth daily.   oxyCODONE -acetaminophen  5-325 MG  tablet Commonly known as: Percocet Take 1 tablet by mouth every 6 (six) hours as needed.   rosuvastatin  20 MG tablet Commonly known as: CRESTOR  Take 20 mg by mouth at bedtime.        Discharge Instructions: [facility] Discharge instructions Lower Extremity Bypass Surgery  Please refer to the following instruction for your post-procedure care. Your surgeon or physician assistant will discuss any changes with you.  Activity  You are encouraged to walk as much as you can. You can slowly return to normal activities during the month after your surgery. Avoid strenuous activity and heavy lifting until your doctor tells you it's OK. Avoid activities such as vacuuming or swinging a golf club. Do not drive until your doctor give the OK and you are no longer taking prescription pain medications. It is also normal to have difficulty with  sleep habits, eating and bowel movement after surgery. These will go away with time.  Bathing/Showering  You may shower after you go home. Do not soak in a bathtub, hot tub, or swim until the incision heals completely.  Incision Care  Clean your incision with mild soap and water . Shower every day. Pat the area dry with a clean towel. You do not need a bandage unless otherwise instructed. Do not apply any ointments or creams to your incision. If you have open wounds you will be instructed how to care for them or a visiting nurse may be arranged for you. If you have staples or sutures along your incision they will be removed at your post-op appointment. You may have skin glue on your incision. Do not peel it off. It will come off on its own in about one week.  Wash the groin wound with soap and water  daily and pat dry. (No tub bath-only shower)  Then put a dry gauze or washcloth in the groin to keep this area dry to help prevent wound infection.  Do this daily and as needed.  Do not use Vaseline or neosporin on your incisions.  Only  use soap and water  on your incisions and then protect and keep dry.  Diet  Resume your normal diet. There are no special food restrictions following this procedure. A low fat/ low cholesterol diet is recommended for all patients with vascular disease. In order to heal from your surgery, it is CRITICAL to get adequate nutrition. Your body requires vitamins, minerals, and protein. Vegetables are the best source of vitamins and minerals. Vegetables also provide the perfect balance of protein. Processed food has little nutritional value, so try to avoid this.  Medications  Resume taking all your medications unless your doctor or Physician Assistant tells you not to. If your incision is causing pain, you may take over-the-counter pain relievers such as acetaminophen  (Tylenol ). If you were prescribed a stronger pain medication, please aware these medication can cause nausea and constipation. Prevent nausea by taking the medication with a snack or meal. Avoid constipation by drinking plenty of fluids and eating foods with high amount of fiber, such as fruits, vegetables, and grains. Take Colace 100 mg (an over-the-counter stool softener) twice a day as needed for constipation.  Do not take Tylenol  if you are taking prescription pain medications.  Follow Up  Our office will schedule a follow up appointment 2-3 weeks following discharge.  Please call us  immediately for any of the following conditions  Severe or worsening pain in your legs or feet while at rest or while walking Increase pain, redness, warmth, or drainage (pus) from your incision site(s) Fever of 101 degree or higher The swelling in your leg with the bypass suddenly worsens and becomes more painful than when you were in the hospital If you have been instructed to feel your graft pulse then you should do so every day. If you can no longer feel this pulse, call the office immediately. Not all patients are given this instruction.  Leg  swelling is common after leg bypass surgery.  The swelling should improve over a few months following surgery. To improve the swelling, you may elevate your legs above the level of your heart while you are sitting or resting. Your surgeon or physician assistant may ask you to apply an ACE wrap or wear compression (TED) stockings to help to reduce swelling.  Reduce your risk of vascular disease  Stop smoking. If you would  like help call [facility] at 1-800-QUIT-NOW ((570)515-2465) or [place] at [phone].  Manage your cholesterol Maintain a desired weight Control your diabetes weight Control your diabetes Keep your blood pressure down  If you have any questions, please call the office at [phone]   Prescriptions given: 1.  Roxicet #20 No Refill 2.  Plavix  75mg  daily #90 with 3 refills  Disposition: home  Patient's condition: is Good  Follow up: 1. [facility] in 2-3 weeks   Lucie Apt, PA-C [facility] [phone] 02/14/2024  12:19 PM  - For [facility]QI Registry use ---   Post-op:  Wound infection: No  Graft infection: No  Transfusion: No    If yes, n/a units given New Arrhythmia: No Ipsilateral amputation: No, [ ]  Minor, [ ]  BKA, [ ]  AKA Discharge patency: [x ] Primary, [ ]  Primary assisted, [ ]  Secondary, [ ]  Occluded Patency judged by: [x ] Dopper only, [ ]  Palpable graft pulse, []  Palpable distal pulse, [ ]  ABI inc. > 0.15, [ ]  Duplex Discharge ABI: R not done, L  D/C Ambulatory Status: Ambulatory  Complications: MI: No, [ ]  Troponin only, [ ]  EKG or Clinical CHF: No Resp failure:No, [ ]  Pneumonia, [ ]  Ventilator Chg in renal function: No, [ ]  Inc. Cr > 0.5, [ ]  Temp. Dialysis,  [ ]  Permanent dialysis Stroke: No, [ ]  Minor, [ ]  Major Return to OR: No  Reason for return to OR: [ ]  Bleeding, [ ]  Infection, [ ]  Thrombosis, [ ]  Revision  Discharge medications: Statin use:  yes ASA use:  yes Plavix  use:  yes Beta blocker use: no CCB use:   Yes ACEI use:   no ARB use:  yes Coumadin use: no

## 2024-02-20 NOTE — Telephone Encounter (Signed)
 Left message regarding patient's stress test tomorrow. Advised to be nothing by mouth after midnight and to take morning medicines with a sip of water. Gave callback number if any questions.  Outgoing call

## 2024-02-27 ENCOUNTER — Ambulatory Visit: Attending: Surgery | Admitting: Physician Assistant

## 2024-02-27 DIAGNOSIS — I70213 Atherosclerosis of native arteries of extremities with intermittent claudication, bilateral legs: Secondary | ICD-10-CM

## 2024-02-27 NOTE — Progress Notes (Signed)
" °  POST OPERATIVE OFFICE NOTE    CC:  F/u for surgery  HPI:  Gary Harrison is a 68 y.o. male who is here for incision check.  He recently underwent right common femoral endarterectomy including profundoplasty and proximal SFA endarterectomy with vein patch angioplasty and right SFA stenting on 02/12/2024 by Dr. Gretta.  This was done for disabling right lower extremity claudication.  He returns today for follow-up.  He has no complaints at today's office visit.  He feels like his right groin incision is healing well.  He denies any drainage from this area, tenderness, or swelling.  He denies any fevers or chills.  He says he has not been walking long distances yet but his claudication has improved and he can walk to and from his mailbox without calf cramping.  He also has no more cramping in his right foot at night.   He is taking a daily aspirin , Plavix , and statin.   Allergies[1]  Current Outpatient Medications  Medication Sig Dispense Refill   amLODipine  (NORVASC ) 10 MG tablet Take 1 tablet (10 mg total) by mouth daily. 90 tablet 3   aspirin  EC 81 MG tablet Take 81 mg by mouth daily. Swallow whole.     clopidogrel  (PLAVIX ) 75 MG tablet Take 1 tablet (75 mg total) by mouth daily at 6 (six) AM. 90 tablet 3   fluticasone  (FLONASE ) 50 MCG/ACT nasal spray SHAKE LIQUID AND USE 2 SPRAYS IN EACH NOSTRIL EVERY DAY 16 g 2   olmesartan (BENICAR) 40 MG tablet Take 40 mg by mouth daily.     oxyCODONE -acetaminophen  (PERCOCET) 5-325 MG tablet Take 1 tablet by mouth every 6 (six) hours as needed. 20 tablet 0   rosuvastatin  (CRESTOR ) 20 MG tablet Take 20 mg by mouth at bedtime.     No current facility-administered medications for this visit.    ROS:  See HPI  Physical Exam:  Incision: Right groin incision well-healed without signs of infection or dehiscence Extremities: Brisk right DP/PT Doppler signals Neuro: Intact motor and sensation of right foot, alert and oriented x  3     Assessment/Plan:  This is a 68 y.o. male who is here for a postop visit  - The patient recently underwent right common femoral endarterectomy, vein patch angioplasty, and right SFA stenting for disabling right lower extremity claudication.  His right groin incision is well-healed without signs of infection or dehiscence -His nocturnal right foot cramping and lower extremity claudication has gone away.  He says he can walk to his mailbox and back without any cramping.  He has not gone any long distances yet. - On exam he has brisk right DP/PT Doppler signals.  He has intact motor and sensation of the right foot -I have encouraged the patient to try to walk longer distances to continue to improve his blood flow.  He is tolerating all of his medications, so we will continue aspirin , Plavix , and statin.  He can follow-up with our office in 5-6 weeks with right lower extremity arterial duplex and ABIs   Ahmed Holster, PA-C [facility] [phone]   Clinic MD:  Gretta     [1]  Allergies Allergen Reactions   Iodine  Swelling   "

## 2024-03-04 ENCOUNTER — Other Ambulatory Visit: Payer: Self-pay | Admitting: *Deleted

## 2024-03-04 ENCOUNTER — Telehealth: Payer: Self-pay | Admitting: Internal Medicine

## 2024-03-04 DIAGNOSIS — I70221 Atherosclerosis of native arteries of extremities with rest pain, right leg: Secondary | ICD-10-CM

## 2024-03-04 DIAGNOSIS — I70213 Atherosclerosis of native arteries of extremities with intermittent claudication, bilateral legs: Secondary | ICD-10-CM

## 2024-03-04 NOTE — Telephone Encounter (Signed)
 Spoke with patient - last seem 10/2023. Recently had PV procedure. Had f/u with PA 12/23. Forgot to ask about driving restrictions. Has been unable to reach this practice. Advised will send message to PA who saw him for followup.

## 2024-03-04 NOTE — Telephone Encounter (Signed)
 Patient stated he had a stent placed in his leg on 12/8 and wants to know if he is now cleared to drive.

## 2024-03-05 ENCOUNTER — Telehealth: Payer: Self-pay

## 2024-03-05 NOTE — Telephone Encounter (Signed)
 Pt called to ask if he can drive. He states he is no longer on pain medications and has driven short distances to run errands. He has intact motor and sensation in his foot and is not on any narcotics, so advised him as long as he is comfortable with doing so, he can drive. No further questions/concerns at this time.

## 2024-03-08 NOTE — Telephone Encounter (Signed)
 Addressed by [facility] team

## 2024-04-10 ENCOUNTER — Ambulatory Visit (HOSPITAL_COMMUNITY)
Admission: RE | Admit: 2024-04-10 | Discharge: 2024-04-10 | Disposition: A | Payer: Medicare (Managed Care) | Source: Ambulatory Visit | Attending: Vascular Surgery

## 2024-04-10 ENCOUNTER — Ambulatory Visit (HOSPITAL_COMMUNITY)
Admission: RE | Admit: 2024-04-10 | Discharge: 2024-04-10 | Disposition: A | Source: Ambulatory Visit | Attending: Vascular Surgery

## 2024-04-10 ENCOUNTER — Ambulatory Visit: Payer: Medicare (Managed Care) | Admitting: Physician Assistant

## 2024-04-10 VITALS — BP 137/82 | Ht 71.0 in | Wt 175.3 lb

## 2024-04-10 DIAGNOSIS — I70213 Atherosclerosis of native arteries of extremities with intermittent claudication, bilateral legs: Secondary | ICD-10-CM

## 2024-04-10 DIAGNOSIS — I70221 Atherosclerosis of native arteries of extremities with rest pain, right leg: Secondary | ICD-10-CM | POA: Diagnosis not present

## 2024-04-10 LAB — VAS US ABI WITH/WO TBI
Left ABI: 0.51
Right ABI: 0.54

## 2024-04-10 NOTE — Progress Notes (Unsigned)
 " Office Note     CC:  follow up Requesting Provider:  Verdia Lombard, MD  HPI: Gary Harrison is a 69 y.o. (12/18/1955) male status post right common femoral endarterectomy including profundoplasty with vein patch angioplasty and right SFA stenting on 02/12/2024 by Dr. Gretta.  This was performed due to disabling claudication of the right lower extremity.  Right groin incision has healed.  He also reports that claudication symptoms have resolved after surgery.  He returns today for imaging.  He complains of claudication of the left lower extremity however currently symptoms are tolerable.  He is on aspirin , Plavix , statin daily.  He continues to smoke daily.   Past Medical History:  Diagnosis Date   ED (erectile dysfunction)    H pylori ulcer    2014, multiple gastric and duodenal ulcers due to H. pylori.  Treated with amoxicillin , Biaxin , PPI   History of kidney stones    Hypertension    no meds currently   Impaired fasting glucose    Peripheral vascular disease     Past Surgical History:  Procedure Laterality Date   ABDOMINAL AORTOGRAM W/LOWER EXTREMITY N/A 01/18/2024   Procedure: ABDOMINAL AORTOGRAM W/LOWER EXTREMITY;  Surgeon: Gretta Lonni PARAS, MD;  Location: [facility];  Service: Cardiovascular;  Laterality: N/A;   COLONOSCOPY  10/18/2007     DOQ:Uymzz descending colon polyps.  The polyps ranged from 8 mm  to 1.2 cm.  One pedunculated sigmoid colon polyp removed via snare cautery; others via cold forceps. One 3-mm cecal polyp removed via cold forceps.  Otherwise, no masses, inflammatory changes, diverticular, or arteriovenous malformations seen. simple adenomas. Needs surveillance 2014   COLONOSCOPY WITH ESOPHAGOGASTRODUODENOSCOPY (EGD) N/A 01/25/2013   Dr. harvey: Multiple small ulcers seen in the gastric antrum as well as the duodenum.  Biopsies showed H. pylori gastritis.  Need to come back for 42-month follow-up EGD but he did not have this done.  Colonoscopy  showed small internal hemorrhoids, 8 colon polyps removed only one was a simple adenoma.  Next colonoscopy in 10 years.   COLONOSCOPY WITH PROPOFOL  N/A 05/02/2023   Procedure: COLONOSCOPY WITH PROPOFOL ;  Surgeon: Cindie Carlin MARLA, DO;  Location: [facility];  Service: Endoscopy;  Laterality: N/A;  10:30am, asa 2   ENDARTERECTOMY FEMORAL Right 02/12/2024   Procedure: ENDARTERECTOMY, FEMORAL with Vein Patch, Angiogram, Angioplasty, Profundaplasty, right leg stenting of SFA;  Surgeon: Gretta Lonni PARAS, MD;  Location: [facility] OR;  Service: Vascular;  Laterality: Right;   LOWER EXTREMITY ANGIOGRAPHY N/A 01/18/2024   Procedure: Lower Extremity Angiography;  Surgeon: Gretta Lonni PARAS, MD;  Location: [facility];  Service: Cardiovascular;  Laterality: N/A;   POLYPECTOMY  05/02/2023   Procedure: POLYPECTOMY;  Surgeon: Cindie Carlin MARLA, DO;  Location: [facility];  Service: Endoscopy;;   ULTRASOUND GUIDANCE FOR VASCULAR ACCESS  02/12/2024   Procedure: ULTRASOUND GUIDANCE, FOR VASCULAR ACCESS;  Surgeon: Gretta Lonni PARAS, MD;  Location: [facility] OR;  Service: Vascular;;    Social History   Socioeconomic History   Marital status: Married    Spouse name: Not on file   Number of children: 4   Years of education: Not on file   Highest education level: Not on file  Occupational History    Comment: Retired  Tobacco Use   Smoking status: Every Day    Current packs/day: 0.50    Types: Cigarettes   Smokeless tobacco: Never   Tobacco comments:    Under a pack a day,  15 a day, does not want to quit  Vaping Use   Vaping status: Never Used  Substance and Sexual Activity   Alcohol use: Yes    Comment: Consumes a mix drinking at least 3 beers every day.,  Remote DUI in the 80s.   Drug use: No   Sexual activity: Not on file  Other Topics Concern   Not on file  Social History Narrative   Not on file   Social Drivers of Health   Tobacco Use: High Risk (04/10/2024)   Patient History    Smoking  Tobacco Use: Every Day    Smokeless Tobacco Use: Never    Passive Exposure: Not on file  Financial Resource Strain: Not on file  Food Insecurity: Not on file  Transportation Needs: Not on file  Physical Activity: Not on file  Stress: Not on file  Social Connections: Unknown (02/12/2024)   Social Connection and Isolation Panel    Frequency of Communication with Friends and Family: Not on file    Frequency of Social Gatherings with Friends and Family: Not on file    Attends Religious Services: Not on file    Active Member of Clubs or Organizations: Not on file    Attends Banker Meetings: Not on file    Marital Status: Patient declined  Intimate Partner Violence: Not on file  Depression (PHQ2-9): Not on file  Alcohol Screen: Not on file  Housing: Not on file  Utilities: Not on file  Health Literacy: Not on file    Family History  Problem Relation Age of Onset   Cancer Mother    Cancer Father    Colon cancer Neg Hx     Current Outpatient Medications  Medication Sig Dispense Refill   amLODipine  (NORVASC ) 10 MG tablet Take 1 tablet (10 mg total) by mouth daily. 90 tablet 3   aspirin  EC 81 MG tablet Take 81 mg by mouth daily. Swallow whole.     clopidogrel  (PLAVIX ) 75 MG tablet Take 1 tablet (75 mg total) by mouth daily at 6 (six) AM. 90 tablet 3   fluticasone  (FLONASE ) 50 MCG/ACT nasal spray SHAKE LIQUID AND USE 2 SPRAYS IN EACH NOSTRIL EVERY DAY 16 g 2   olmesartan (BENICAR) 40 MG tablet Take 40 mg by mouth daily.     oxyCODONE -acetaminophen  (PERCOCET) 5-325 MG tablet Take 1 tablet by mouth every 6 (six) hours as needed. 20 tablet 0   rosuvastatin  (CRESTOR ) 20 MG tablet Take 20 mg by mouth at bedtime.     No current facility-administered medications for this visit.    Allergies[1]   REVIEW OF SYSTEMS:  Negative unless noted in HPI [X]  denotes positive finding, [ ]  denotes negative finding Cardiac  Comments:  Chest pain or chest pressure:    Shortness of  breath upon exertion:    Short of breath when lying flat:    Irregular heart rhythm:        Vascular    Pain in calf, thigh, or hip brought on by ambulation:    Pain in feet at night that wakes you up from your sleep:     Blood clot in your veins:    Leg swelling:         Pulmonary    Oxygen at home:    Productive cough:     Wheezing:         Neurologic    Sudden weakness in arms or legs:     Sudden numbness in arms  or legs:     Sudden onset of difficulty speaking or slurred speech:    Temporary loss of vision in one eye:     Problems with dizziness:         Gastrointestinal    Blood in stool:     Vomited blood:         Genitourinary    Burning when urinating:     Blood in urine:        Psychiatric    Major depression:         Hematologic    Bleeding problems:    Problems with blood clotting too easily:        Skin    Rashes or ulcers:        Constitutional    Fever or chills:      PHYSICAL EXAMINATION:  Vitals:   04/10/24 1217  BP: 137/82  Weight: 175 lb 4.8 oz (79.5 kg)  Height: 5' 11 (1.803 m)    General:  WDWN in NAD; vital signs documented above Gait: Not observed HENT: WNL, normocephalic Pulmonary: normal non-labored breathing Cardiac: regular HR Abdomen: soft, NT, no masses Skin: without rashes Vascular Exam/Pulses: R DP and PT by doppler; L PT by doppler Extremities: without ischemic changes, without Gangrene , without cellulitis; without open wounds; R groin incision healed Musculoskeletal: no muscle wasting or atrophy  Neurologic: A&O X 3 Psychiatric:  The pt has Normal affect.   Non-Invasive Vascular Imaging:    Right lower extremity arterial duplex demonstrates a patent common femoral artery however the profunda is occluded  Monophasic waveforms throughout the SFA with low flow velocity proximal to the SFA stent  472 cm/s in the proximal popliteal artery    ABI/TBIToday's ABIToday's TBIPrevious ABIPrevious TBI   +-------+-----------+-----------+------------+------------+  Right .54        .24        .33         0.0           +-------+-----------+-----------+------------+------------+  Left  .51        .15        .45         .24           +-------+-----------+-----------+------------+----------    ASSESSMENT/PLAN:: 69 y.o. male status post right common femoral artery endarterectomy with profundoplasty and vein patch angioplasty with right SFA stenting by Dr. Gretta due to disabling claudication  Overall Gary Harrison continues to do well.  He is pleased that his claudication symptoms have completely resolved postoperatively.  His right groin incision is well-healed.  Unfortunately duplex demonstrates an occluded profunda and also demonstrates monophasic flow through his SFA stent with low flow velocity.  He also has a hemodynamically significant proximal popliteal lesion.  He is taking his aspirin  and Plavix  and statin regularly on a daily basis.  He unfortunately continues to smoke.  Imaging findings will be discussed with Dr. Gretta however patient will likely require angiography via left common femoral artery to evaluate the right lower extremity.  I will notify the patient over the phone with a plan once Case has been discussed with Dr. Gretta.    Donnice Sender, PA-C [facility] [phone]  Clinic MD:   Sheree     [1]  Allergies Allergen Reactions   Iodine  Swelling   "
